# Patient Record
Sex: Female | Born: 1985 | Race: Black or African American | Hispanic: No | Marital: Single | Smoking: Current every day smoker
Health system: Southern US, Community
[De-identification: ages and names within clinical notes are randomized; demographics above are authoritative.]

## PROBLEM LIST (undated history)

## (undated) DIAGNOSIS — Z9889 Other specified postprocedural states: Secondary | ICD-10-CM

## (undated) DIAGNOSIS — R011 Cardiac murmur, unspecified: Secondary | ICD-10-CM

## (undated) DIAGNOSIS — J45909 Unspecified asthma, uncomplicated: Secondary | ICD-10-CM

## (undated) DIAGNOSIS — R112 Nausea with vomiting, unspecified: Secondary | ICD-10-CM

## (undated) HISTORY — DX: Unspecified asthma, uncomplicated: J45.909

## (undated) HISTORY — PX: LEEP: SHX91

---

## 2003-11-21 HISTORY — PX: LEEP: SHX91

## 2004-10-16 ENCOUNTER — Emergency Department: Payer: Self-pay | Admitting: Emergency Medicine

## 2004-11-08 ENCOUNTER — Ambulatory Visit: Payer: Self-pay | Admitting: Unknown Physician Specialty

## 2005-01-21 ENCOUNTER — Emergency Department: Payer: Self-pay | Admitting: Emergency Medicine

## 2006-05-02 ENCOUNTER — Observation Stay: Payer: Self-pay | Admitting: Obstetrics and Gynecology

## 2006-05-09 ENCOUNTER — Inpatient Hospital Stay: Payer: Self-pay | Admitting: Obstetrics & Gynecology

## 2007-11-21 ENCOUNTER — Emergency Department: Payer: Self-pay | Admitting: Emergency Medicine

## 2009-08-10 ENCOUNTER — Emergency Department: Payer: Self-pay | Admitting: Unknown Physician Specialty

## 2010-08-02 ENCOUNTER — Emergency Department: Payer: Self-pay | Admitting: Emergency Medicine

## 2014-12-30 ENCOUNTER — Emergency Department: Payer: Self-pay | Admitting: Emergency Medicine

## 2016-03-16 LAB — HM PAP SMEAR: HM Pap smear: NEGATIVE

## 2016-12-20 ENCOUNTER — Encounter: Payer: Self-pay | Admitting: Emergency Medicine

## 2016-12-20 ENCOUNTER — Emergency Department
Admission: EM | Admit: 2016-12-20 | Discharge: 2016-12-20 | Disposition: A | Discharge status: Home / Self Care | Payer: No Typology Code available for payment source | Attending: Emergency Medicine | Admitting: Emergency Medicine

## 2016-12-20 DIAGNOSIS — Y9389 Activity, other specified: Secondary | ICD-10-CM | POA: Insufficient documentation

## 2016-12-20 DIAGNOSIS — F17210 Nicotine dependence, cigarettes, uncomplicated: Secondary | ICD-10-CM | POA: Insufficient documentation

## 2016-12-20 DIAGNOSIS — S8001XA Contusion of right knee, initial encounter: Secondary | ICD-10-CM | POA: Diagnosis not present

## 2016-12-20 DIAGNOSIS — Y999 Unspecified external cause status: Secondary | ICD-10-CM | POA: Diagnosis not present

## 2016-12-20 DIAGNOSIS — Y92410 Unspecified street and highway as the place of occurrence of the external cause: Secondary | ICD-10-CM | POA: Insufficient documentation

## 2016-12-20 DIAGNOSIS — S8991XA Unspecified injury of right lower leg, initial encounter: Secondary | ICD-10-CM | POA: Diagnosis present

## 2016-12-20 HISTORY — DX: Cardiac murmur, unspecified: R01.1

## 2016-12-20 MED ORDER — CYCLOBENZAPRINE HCL 5 MG PO TABS: 5.0000 mg | ORAL_TABLET | Freq: Three times a day (TID) | ORAL | 0 refills | Status: DC | PRN
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
## 2017-04-18 ENCOUNTER — Encounter: Payer: Self-pay | Admitting: *Deleted

## 2017-04-18 ENCOUNTER — Emergency Department
Admission: EM | Admit: 2017-04-18 | Discharge: 2017-04-18 | Disposition: A | Discharge status: Home / Self Care | Payer: Medicaid Other | Attending: Emergency Medicine | Admitting: Emergency Medicine

## 2017-04-18 DIAGNOSIS — R05 Cough: Secondary | ICD-10-CM | POA: Diagnosis present

## 2017-04-18 DIAGNOSIS — J069 Acute upper respiratory infection, unspecified: Secondary | ICD-10-CM | POA: Diagnosis not present

## 2017-04-18 DIAGNOSIS — F17210 Nicotine dependence, cigarettes, uncomplicated: Secondary | ICD-10-CM | POA: Diagnosis not present

## 2017-04-18 MED ORDER — GUAIFENESIN-CODEINE 100-10 MG/5ML PO SOLN: 10.0000 mL | Freq: Three times a day (TID) | ORAL | 0 refills | Status: DC | PRN

## 2017-04-18 NOTE — ED Provider Notes (Signed)
[facility] Emergency Department Provider Note  ____________________________________________  Time seen: Approximately 2:56 PM  I have reviewed the triage vital signs and the nursing notes.   HISTORY  Chief Complaint Cough; Facial Pain; and Pleurisy   HPI Evelyn Stevenson is a 31 y.o. female who presents to the emergency department for evaluation of cold symptoms for the past 3 days. Chest discomfort with cough for the past 2 days that feels like pressure. She has taken OTC Allegra. Cough interrupts sleep and is productive of yellow/green sputum. No fever, no headache, nausea, or vomiting.    Past Medical History:  Diagnosis Date  . Heart murmur     There are no active problems to display for this patient.   Past Surgical History:  Procedure Laterality Date  . CESAREAN SECTION  2007    Prior to Admission medications   Medication Sig Start Date End Date Taking? Authorizing Provider  cyclobenzaprine (FLEXERIL) 5 MG tablet Take 1 tablet (5 mg total) by mouth 3 (three) times daily as needed for muscle spasms. 12/20/16   Menshew, Charlesetta Ivory, PA-C  guaiFENesin-codeine 100-10 MG/5ML syrup Take 10 mLs by mouth 3 (three) times daily as needed. 04/18/17   Chinita Pester, FNP    Allergies Patient has no known allergies.  History reviewed. No pertinent family history.  Social History Social History  Substance Use Topics  . Smoking status: Current Every Day Smoker    Packs/day: 0.50    Types: Cigarettes  . Smokeless tobacco: Never Used  . Alcohol use Yes     Comment: occassional    Review of Systems Constitutional: Negative fever/chills ENT: Negative sore throat. Right otalgia. Cardiovascular: Denies chest pain. Respiratory: Occasional shortness of breath. Positive for cough. Gastrointestinal: Negative for nausea,  Negative for vomiting.  Negative for diarrhea.  Musculoskeletal: Negative for body aches Skin: Negative for rash. Neurological:  Negative for headaches ____________________________________________   PHYSICAL EXAM:  VITAL SIGNS: ED Triage Vitals [04/18/17 1356]  Enc Vitals Group     BP 125/72     Pulse Rate 84     Resp 16     Temp 98.3 F (36.8 C)     Temp Source Oral     SpO2 100 %     Weight 230 lb (104.3 kg)     Height 5\' 9"  (1.753 m)     Head Circumference      Peak Flow      Pain Score 5     Pain Loc      Pain Edu?      Excl. in GC?     Constitutional: Alert and oriented. Well appearing and in no acute distress. Eyes: Conjunctivae are normal. EOMI. Ears: Bilateral TM mildly erythematous without loss of light reflex. Nose: Sinus congestion noted; no rhinnorhea. Mouth/Throat: Mucous membranes are moist.  Oropharynx normal. Neck: No stridor.  Lymphatic: No cervical lymphadenopathy. Cardiovascular: Normal rate, regular rhythm. Good peripheral circulation. Respiratory: Normal respiratory effort.  No retractions. Breath sounds clear to auscultation.. Gastrointestinal: Soft and nontender.  Musculoskeletal: FROM x 4 extremities.  Neurologic:  Normal speech and language.  Skin:  Skin is warm, dry and intact. No rash noted. Psychiatric: Mood and affect are normal. Speech and behavior are normal.  ____________________________________________   LABS (all labs ordered are listed, but only abnormal results are displayed)  Labs Reviewed - No data to display ____________________________________________  EKG  Ventricular rate 81 and regular. Normal sinus rhythm ____________________________________________  RADIOLOGY  Not  indicated. ____________________________________________   PROCEDURES  Procedure(s) performed: None  Critical Care performed: No ____________________________________________   INITIAL IMPRESSION / ASSESSMENT AND PLAN / ED COURSE  31 year old female presenting to the ER for evaluation of URI symptoms. She will be treated with Robitussin AC and advised to follow up with  her PCP for symptoms that are not improving over the next few days. She is to return to the ER for symptoms that change or worsen if unable to schedule an appointment.  Pertinent labs & imaging results that were available during my care of the patient were reviewed by me and considered in my medical decision making (see chart for details).  New Prescriptions   GUAIFENESIN-CODEINE 100-10 MG/5ML SYRUP    Take 10 mLs by mouth 3 (three) times daily as needed.    If controlled substance prescribed during this visit, 12 month history viewed on the [place]SRS prior to issuing an initial prescription for Schedule II or III opiod. ____________________________________________   FINAL CLINICAL IMPRESSION(S) / ED DIAGNOSES  Final diagnoses:  Acute upper respiratory infection    Note:  This document was prepared using Dragon voice recognition software and may include unintentional dictation errors.     Chinita Pesterriplett, Kimmie Doren B, FNP 04/18/17 1517    Minna AntisPaduchowski, Kevin, MD 04/22/17 1418

## 2017-04-18 NOTE — ED Triage Notes (Signed)
Pt states head congestion and cold for 3 days, states she now has a cough and is coughing up green yellow sputum, states chest tightness, awake and alert in no acute distress

## 2017-10-31 ENCOUNTER — Emergency Department
Admission: EM | Admit: 2017-10-31 | Discharge: 2017-10-31 | Disposition: A | Discharge status: Home / Self Care | Payer: Medicaid Other | Attending: Emergency Medicine | Admitting: Emergency Medicine

## 2017-10-31 ENCOUNTER — Other Ambulatory Visit: Payer: Self-pay

## 2017-10-31 ENCOUNTER — Encounter: Payer: Self-pay | Admitting: Emergency Medicine

## 2017-10-31 DIAGNOSIS — F17210 Nicotine dependence, cigarettes, uncomplicated: Secondary | ICD-10-CM | POA: Diagnosis not present

## 2017-10-31 DIAGNOSIS — B349 Viral infection, unspecified: Secondary | ICD-10-CM | POA: Diagnosis not present

## 2017-10-31 DIAGNOSIS — M7918 Myalgia, other site: Secondary | ICD-10-CM | POA: Diagnosis present

## 2017-10-31 MED ORDER — GUAIFENESIN-CODEINE 100-10 MG/5ML PO SOLN: 5.0000 mL | ORAL | 0 refills | Status: DC | PRN

## 2017-10-31 NOTE — ED Provider Notes (Signed)
[facility] Emergency Department Provider Note  ____________________________________________   First MD Initiated Contact with Patient 10/31/17 [phone]     (approximate)  I have reviewed the triage vital signs and the nursing notes.   HISTORY  Chief Complaint Generalized Body Aches   HPI Evelyn Stevenson is a 31 y.o. female is here with complaint offlu like symptoms for 6 days. Patient is been taking Tylenol and an over-the-counter cold and flu preparation. She complains of generalized body aches and cough. Patient continues to smoke one cigar per day. Patient complains of increased pain after coughing. She rates her pain as an 8 out of 10.   Past Medical History:  Diagnosis Date  . Heart murmur     There are no active problems to display for this patient.   Past Surgical History:  Procedure Laterality Date  . CESAREAN SECTION  2007    Prior to Admission medications   Medication Sig Start Date End Date Taking? Authorizing Provider  guaiFENesin-codeine 100-10 MG/5ML syrup Take 5 mLs by mouth every 4 (four) hours as needed. 10/31/17   Tommi RumpsSummers, Rhonda L, PA-C    Allergies Patient has no known allergies.  No family history on file.  Social History Social History   Tobacco Use  . Smoking status: Current Every Day Smoker    Packs/day: 0.50    Types: Cigarettes  . Smokeless tobacco: Never Used  Substance Use Topics  . Alcohol use: Yes    Comment: occassional  . Drug use: No    Review of Systems Constitutional: subjective fever/chills Eyes: No visual changes. ENT: positive sore throat. Positive nasal congestion. Cardiovascular: Denies chest pain. Respiratory: Denies shortness of breath. Positive cough. Gastrointestinal: No abdominal pain.  No nausea, no vomiting.  No diarrhea.   Skin: Negative for rash. Neurological: Negative for headaches, focal weakness or numbness. ____________________________________________   PHYSICAL  EXAM:  VITAL SIGNS: ED Triage Vitals  Enc Vitals Group     BP 10/31/17 0906 119/78     Pulse Rate 10/31/17 0906 89     Resp 10/31/17 0906 13     Temp 10/31/17 0906 98.3 F (36.8 C)     Temp Source 10/31/17 0906 Oral     SpO2 10/31/17 0906 99 %     Weight 10/31/17 0843 230 lb (104.3 kg)     Height 10/31/17 0907 5\' 9"  (1.753 m)     Head Circumference --      Peak Flow --      Pain Score 10/31/17 0843 8     Pain Loc --      Pain Edu? --      Excl. in GC? --    Constitutional: Alert and oriented. Well appearing and in no acute distress. Eyes: Conjunctivae are normal.  Head: Atraumatic. Nose: mild congestion/rhinnorhea. Mouth/Throat: Mucous membranes are moist.  Oropharynx non-erythematous. Neck: No stridor.   Hematological/Lymphatic/Immunilogical: No cervical lymphadenopathy. Cardiovascular: Normal rate, regular rhythm. Grossly normal heart sounds.  Good peripheral circulation. Respiratory: Normal respiratory effort.  No retractions. Lungs CTAB.  occasional nonproductive cough. Gastrointestinal: Soft and nontender. No distention. Bowel sounds normoactive 4 quadrants. Musculoskeletal: moves upper and lower extremities faint difficulty. Normal gait was noted. Neurologic:  Normal speech and language. No gross focal neurologic deficits are appreciated.  Skin:  Skin is warm, dry and intact.  Psychiatric: Mood and affect are normal. Speech and behavior are normal.  ____________________________________________   LABS (all labs ordered are listed, but only abnormal results are displayed)  Labs Reviewed - No data to display   PROCEDURES  Procedure(s) performed: None  Procedures  Critical Care performed: No  ____________________________________________   INITIAL IMPRESSION / ASSESSMENT AND PLAN / ED COURSE  Patient was made aware that this was a viral illness and that she should continue taking over-the-counter medication such as Tylenol or ibuprofen. She is encouraged to  increase fluids. She was given a prescription for Robitussin AC as needed for cough. She will [facility] if any continued problems. ____________________________________________   FINAL CLINICAL IMPRESSION(S) / ED DIAGNOSES  Final diagnoses:  Viral illness     ED Discharge Orders        Ordered    guaiFENesin-codeine 100-10 MG/5ML syrup  Every 4 hours PRN     10/31/17 1011       Note:  This document was prepared using Dragon voice recognition software and may include unintentional dictation errors.    Tommi RumpsSummers, Rhonda L, PA-C 10/31/17 1150    Jene EveryKinner, Robert, MD 10/31/17 1258

## 2017-10-31 NOTE — Discharge Instructions (Signed)
Continue Tylenol or ibuprofen as needed for body aches and fever. Increase fluids. Take cough medication only as directed. [facility] or [facility] if any continued problems.

## 2017-10-31 NOTE — ED Notes (Signed)
See triage note  Presents with cough and body aches since last Thursday  Unsure of fever but felt warm  States cough is prod

## 2017-10-31 NOTE — ED Triage Notes (Signed)
Pt with flu like sx.  

## 2017-11-11 ENCOUNTER — Emergency Department
Admission: EM | Admit: 2017-11-11 | Discharge: 2017-11-11 | Disposition: A | Discharge status: Home / Self Care | Payer: Medicaid Other | Attending: Emergency Medicine | Admitting: Emergency Medicine

## 2017-11-11 ENCOUNTER — Emergency Department: Payer: Medicaid Other

## 2017-11-11 ENCOUNTER — Encounter: Payer: Self-pay | Admitting: Emergency Medicine

## 2017-11-11 ENCOUNTER — Other Ambulatory Visit: Payer: Self-pay

## 2017-11-11 DIAGNOSIS — R05 Cough: Secondary | ICD-10-CM | POA: Diagnosis present

## 2017-11-11 DIAGNOSIS — F17210 Nicotine dependence, cigarettes, uncomplicated: Secondary | ICD-10-CM | POA: Diagnosis not present

## 2017-11-11 DIAGNOSIS — J209 Acute bronchitis, unspecified: Secondary | ICD-10-CM | POA: Diagnosis not present

## 2017-11-11 MED ORDER — PREDNISONE 50 MG PO TABS: ORAL_TABLET | ORAL | 0 refills | Status: DC

## 2017-11-11 MED ORDER — ACETAMINOPHEN 325 MG PO TABS
650.0000 mg | ORAL_TABLET | Freq: Once | ORAL | Status: AC
  Administered 2017-11-11: 650 mg via ORAL
  Filled 2017-11-11: qty 2

## 2017-11-11 MED ORDER — AZITHROMYCIN 250 MG PO TABS: ORAL_TABLET | ORAL | 0 refills | Status: DC

## 2017-11-11 NOTE — ED Notes (Signed)
Patient @ XR 

## 2017-11-11 NOTE — ED Provider Notes (Signed)
[facility] Emergency Department Provider Note  ____________________________________________  Time seen: Approximately 8:30 PM  I have reviewed the triage vital signs and the nursing notes.   HISTORY  Chief Complaint Cough and Generalized Body Aches    HPI Evelyn Stevenson is a 31 y.o. female presents to the emergency department with rhinorrhea, congestion, nonproductive cough and fever.  Patient states that she has been coughing for at least 3 weeks.  Cough is productive for purulent sputum production.  Patient has experienced intermittent shortness of breath.  No chest pain or chest tightness.  She is tolerating fluids and food by mouth.  No major changes in stooling or urinary habits.   Past Medical History:  Diagnosis Date  . Heart murmur     There are no active problems to display for this patient.   Past Surgical History:  Procedure Laterality Date  . CESAREAN SECTION  2007    Prior to Admission medications   Medication Sig Start Date End Date Taking? Authorizing Provider  azithromycin (ZITHROMAX) 250 MG tablet Take 2 tablets the first day. Take one tablet once a day for the next four days. 11/11/17   Orvil FeilWoods, Collie Kittel M, PA-C  guaiFENesin-codeine 100-10 MG/5ML syrup Take 5 mLs by mouth every 4 (four) hours as needed. 10/31/17   Tommi RumpsSummers, Rhonda L, PA-C  predniSONE (DELTASONE) 50 MG tablet Take one tablet once a day for the next 5 days. 11/11/17   Orvil FeilWoods, Stacia Feazell M, PA-C    Allergies Patient has no known allergies.  No family history on file.  Social History Social History   Tobacco Use  . Smoking status: Current Every Day Smoker    Packs/day: 0.50    Types: Cigarettes  . Smokeless tobacco: Never Used  Substance Use Topics  . Alcohol use: Yes    Comment: occassional  . Drug use: No     Review of Systems  Constitutional: Patient has fever.  Eyes: No visual changes. No discharge ENT: No upper respiratory complaints. Cardiovascular: no  chest pain. Respiratory: Patient has cough.  Gastrointestinal: No abdominal pain.  No nausea, no vomiting.  No diarrhea.  No constipation. Musculoskeletal: Negative for musculoskeletal pain. Skin: Negative for rash, abrasions, lacerations, ecchymosis. Neurological: Negative for headaches, focal weakness or numbness.  ___________________________________  PHYSICAL EXAM:  VITAL SIGNS: ED Triage Vitals  Enc Vitals Group     BP 11/11/17 1900 120/89     Pulse Rate 11/11/17 1857 (!) 126     Resp 11/11/17 1857 (!) 32     Temp 11/11/17 1857 98.6 F (37 C)     Temp Source 11/11/17 1857 Rectal     SpO2 11/11/17 1857 99 %     Weight 11/11/17 1857 21 lb 2.6 oz (9.6 kg)     Height 11/11/17 1859 5\' 9"  (1.753 m)     Head Circumference --      Peak Flow --      Pain Score 11/11/17 1859 9     Pain Loc --      Pain Edu? --      Excl. in GC? --      Constitutional: Alert and oriented. Well appearing and in no acute distress. Eyes: Conjunctivae are normal. PERRL. EOMI. Head: Atraumatic. ENT:      Ears: TMs are pearly bilaterally.      Nose: No congestion/rhinnorhea.      Mouth/Throat: Mucous membranes are moist.  Neck: No stridor.  No cervical spine tenderness to palpation. Cardiovascular: Normal rate,  regular rhythm. Normal S1 and S2.  Good peripheral circulation. Respiratory: Normal respiratory effort without tachypnea or retractions. Lungs CTAB. Good air entry to the bases with no decreased or absent breath sounds. Gastrointestinal: Bowel sounds 4 quadrants. Soft and nontender to palpation. No guarding or rigidity. No palpable masses. No distention. No CVA tenderness. Musculoskeletal: Full range of motion to all extremities. No gross deformities appreciated. Neurologic:  Normal speech and language. No gross focal neurologic deficits are appreciated.  Skin:  Skin is warm, dry and intact. No rash noted. Psychiatric: Mood and affect are normal. Speech and behavior are normal. Patient  exhibits appropriate insight and judgement.   ____________________________________________   LABS (all labs ordered are listed, but only abnormal results are displayed)  Labs Reviewed - No data to display ____________________________________________  EKG   ____________________________________________  RADIOLOGY Geraldo PitterI, Hadi Dubin M Audine Mangione, personally viewed and evaluated these images (plain radiographs) as part of my medical decision making, as well as reviewing the written report by the radiologist.  Dg Chest 2 View  Result Date: 11/11/2017 CLINICAL DATA:  Worsening cough with fever and chills. EXAM: CHEST  2 VIEW COMPARISON:  12/30/2014 FINDINGS: The heart size and mediastinal contours are within normal limits. Both lungs are free of consolidation but there is increased perihilar markings suggesting viral pneumonitis. No osseous findings. Worsening aeration from priors. IMPRESSION: Increased perihilar markings suggesting viral pneumonitis. No lobar consolidation. Electronically Signed   By: Elsie StainJohn T Curnes M.D.   On: 11/11/2017 19:49    ____________________________________________    PROCEDURES  Procedure(s) performed:    Procedures    Medications  acetaminophen (TYLENOL) tablet 650 mg (650 mg Oral Given 11/11/17 2015)     ____________________________________________   INITIAL IMPRESSION / ASSESSMENT AND PLAN / ED COURSE  Pertinent labs & imaging results that were available during my care of the patient were reviewed by me and considered in my medical decision making (see chart for details).  Review of the [place] CSRS was performed in accordance of the N[facility] prior to dispensing any controlled drugs.     Assessment and plan Acute bronchitis Patient presents to the emergency department with rhinorrhea, congestion and productive cough.  Chest x-ray revealed findings consistent with viral pneumonitis.  Patient was treated empirically for acute bronchitis with azithromycin and  prednisone.  She was advised to follow-up with primary care as needed.  Vital signs are reassuring prior to discharge.  All patient questions were answered.   ____________________________________________  FINAL CLINICAL IMPRESSION(S) / ED DIAGNOSES  Final diagnoses:  Acute bronchitis, unspecified organism      NEW MEDICATIONS STARTED DURING THIS VISIT:  ED Discharge Orders        Ordered    azithromycin (ZITHROMAX) 250 MG tablet     11/11/17 2001    predniSONE (DELTASONE) 50 MG tablet     11/11/17 2001          This chart was dictated using voice recognition software/Dragon. Despite best efforts to proofread, errors can occur which can change the meaning. Any change was purely unintentional.    Gasper LloydWoods, Teran Knittle M, PA-C 11/11/17 2033    Phineas SemenGoodman, Graydon, MD 11/11/17 2131

## 2017-11-11 NOTE — ED Notes (Signed)
Reviewed discharge instructions, follow-up care, and prescriptions with patient. Patient verbalized understanding of all information reviewed. Patient stable, with no distress noted at this time.    

## 2017-11-11 NOTE — ED Triage Notes (Signed)
FIRST NURSE NOTE-seen earlier this month for viral illness. Still c/o cough, feeling bad and subjective fever. NAD

## 2017-11-11 NOTE — ED Triage Notes (Signed)
Pt states that she was seen last week and diagnosed with viral illness. Pt states that since that time her cough has gotten worse, she is having body aches and chills. Pt in NAD at this time.

## 2017-11-20 NOTE — L&D Delivery Note (Signed)
Delivery Note At 3:01 PM a viable female was delivered via VBAC, Spontaneous (Presentation: LOA).  APGAR: 8, 9; weight 6 lb 10.9 oz (3030 grams).   Placenta status: delivered, spontaneously, intact.  Cord: 3VC with the following complications: .  Cord pH: n/a  Anesthesia:  none Episiotomy: None Lacerations: 1st degree Suture Repair: 3.0 vicryl Quantitative Blood Loss (mL): 680  Mom to postpartum.  Baby to Couplet care / Skin to Skin.  Called to see patient.  Mom had progressed very quickly to complete.  Mom pushed to deliver a viable female infant.  The head followed by shoulders, which delivered without difficulty, and the rest of the body.  No nuchal cord noted.  Baby to mom's chest.  Placenta quickly delivered spontaneously.  It was intact with a 3-vessel cord. Cord clamped and cut due to quick delivery of placenta.  Cord blood obtained.  First degree perineal laceration repaired with 3-0 Vicryl in standard fashion.  All counts correct.  Some heavier-than-normal vaginal bleeding noted after delivery.  Pitocin 10 units IM given after delivery.  She was also given methergine 0.2 mg IM.  IV was then started and hemostasis obtained with IV pitocin and fundal massage. QBL 680 mL.     Thomasene MohairStephen Ruthe Roemer, MD 10/26/2018, 3:50 PM

## 2018-03-26 ENCOUNTER — Other Ambulatory Visit: Payer: Self-pay | Admitting: Nurse Practitioner

## 2018-03-26 DIAGNOSIS — Z369 Encounter for antenatal screening, unspecified: Secondary | ICD-10-CM

## 2018-04-25 ENCOUNTER — Ambulatory Visit (HOSPITAL_BASED_OUTPATIENT_CLINIC_OR_DEPARTMENT_OTHER)
Admission: RE | Admit: 2018-04-25 | Discharge: 2018-04-25 | Disposition: A | Discharge status: Home / Self Care | Payer: Medicaid Other | Source: Ambulatory Visit | Attending: Obstetrics and Gynecology | Admitting: Obstetrics and Gynecology

## 2018-04-25 ENCOUNTER — Ambulatory Visit
Admission: RE | Admit: 2018-04-25 | Discharge: 2018-04-25 | Disposition: A | Discharge status: Home / Self Care | Payer: Medicaid Other | Source: Ambulatory Visit | Attending: Obstetrics and Gynecology | Admitting: Obstetrics and Gynecology

## 2018-04-25 VITALS — BP 119/71 | HR 83 | Temp 98.3°F | Resp 18 | Wt 239.6 lb

## 2018-04-25 DIAGNOSIS — Z3A12 12 weeks gestation of pregnancy: Secondary | ICD-10-CM | POA: Insufficient documentation

## 2018-04-25 DIAGNOSIS — Z369 Encounter for antenatal screening, unspecified: Secondary | ICD-10-CM

## 2018-04-25 DIAGNOSIS — O30041 Twin pregnancy, dichorionic/diamniotic, first trimester: Secondary | ICD-10-CM | POA: Insufficient documentation

## 2018-04-25 DIAGNOSIS — Z3689 Encounter for other specified antenatal screening: Secondary | ICD-10-CM | POA: Insufficient documentation

## 2018-04-25 DIAGNOSIS — O3121X0 Continuing pregnancy after intrauterine death of one fetus or more, first trimester, not applicable or unspecified: Secondary | ICD-10-CM | POA: Diagnosis not present

## 2018-04-25 DIAGNOSIS — Z818 Family history of other mental and behavioral disorders: Secondary | ICD-10-CM

## 2018-04-25 NOTE — Progress Notes (Addendum)
Referring physician:  [facility] Length of Consultation: 40 minutes   [name]  was [facility] to [facility] for genetic counseling to review prenatal screening and testing options as well as the family history of autism.  This note summarizes the information we discussed.    We offered the following routine screening tests for this pregnancy:  First trimester screening, which includes nuchal translucency ultrasound screen and first trimester maternal serum marker screening.  The nuchal translucency has approximately an 80% detection rate for Down syndrome and can be positive for other chromosome abnormalities as well as congenital heart defects.  When combined with a maternal serum marker screening, the detection rate is up to 90% for Down syndrome and up to 97% for trisomy 18.     Maternal serum marker screening, a blood test that measures pregnancy proteins, can provide risk assessments for Down syndrome, trisomy 18, and open neural tube defects (spina bifida, anencephaly). Because it does not directly examine the fetus, it cannot positively diagnose or rule out these problems.  Targeted ultrasound uses high frequency sound waves to create an image of the developing fetus.  An ultrasound is often recommended as a routine means of evaluating the pregnancy.  It is also used to screen for fetal anatomy problems (for example, a heart defect) that might be suggestive of a chromosomal or other abnormality.   Should these screening tests indicate an increased concern, then the following additional testing options would be offered:  The chorionic villus sampling procedure is available for first trimester chromosome analysis.  This involves the withdrawal of a small amount of chorionic villi (tissue from the developing placenta).  Risk of pregnancy loss is estimated to be approximately 1 in 200 to 1 in 100 (0.5 to 1%).  There is approximately a 1% (1 in 100) chance that the CVS  chromosome results will be unclear.  Chorionic villi cannot be tested for neural tube defects.     Amniocentesis involves the removal of a small amount of amniotic fluid from the sac surrounding the fetus with the use of a thin needle inserted through the maternal abdomen and uterus.  Ultrasound guidance is used throughout the procedure.  Fetal cells from amniotic fluid are directly evaluated and > 99.5% of chromosome problems and > 98% of open neural tube defects can be detected. This procedure is generally performed after the 15th week of pregnancy.  The main risks to this procedure include complications leading to miscarriage in less than 1 in 200 cases (0.5%).  As another option for information if the pregnancy is suspected to be an an increased chance for certain chromosome conditions, we also reviewed the availability of cell free fetal DNA testing from maternal blood to determine whether or not the baby may Stevenson either Down syndrome, trisomy 2713, or trisomy 818.  This test utilizes a maternal blood sample and DNA sequencing technology to isolate circulating cell free fetal DNA from maternal plasma.  The fetal DNA can then be analyzed for DNA sequences that are derived from the three most common chromosomes involved in aneuploidy, chromosomes 13, 18, and 21.  If the overall amount of DNA is greater than the expected level for any of these chromosomes, aneuploidy is suspected.  While we do not consider it a replacement for invasive testing and karyotype analysis, a negative result from this testing would be reassuring, though not a guarantee of a normal chromosome complement for the baby.  An abnormal result is certainly  suggestive of an abnormal chromosome complement, though we would still recommend CVS or amniocentesis to confirm any findings from this testing.  Cystic Fibrosis and Spinal Muscular Atrophy (SMA) screening were also discussed with the patient. Both conditions are recessive, which means that  both parents must be carriers in order to Stevenson a child with the disease.  Cystic fibrosis (CF) is one of the most common genetic conditions in persons of Caucasian ancestry.  This condition occurs in approximately 1 in 2,500 Caucasian persons and results in thickened secretions in the lungs, digestive, and reproductive systems.  For a baby to be at risk for having CF, both of the parents must be carriers for this condition.  Approximately 1 in 53 Caucasian persons is a carrier for CF.  Current carrier testing looks for the most common mutations in the gene for CF and can detect approximately 90% of carriers in the Caucasian population.  This means that the carrier screening can greatly reduce, but cannot eliminate, the chance for an individual to Stevenson a child with CF.  If an individual is found to be a carrier for CF, then carrier testing would be available for the partner. As part of [place]'s newborn screening profile, all babies born in the state of [place] a two-tier screening process.  Specimens are first tested to determine the concentration of immunoreactive trypsinogen (IRT).  The top 5% of specimens with the highest IRT values then undergo DNA testing using a panel of over 40 common CF mutations. SMA is a neurodegenerative disorder that leads to atrophy of skeletal muscle and overall weakness.  This condition is also more prevalent in the Caucasian population, with 1 in 40-1 in 60 persons being a carrier and 1 in 6,000-1 in 10,000 children being affected.  There are multiple forms of the disease, with some causing death in infancy to other forms with survival into adulthood.  The genetics of SMA is complex, but carrier screening can detect up to 95% of carriers in the Caucasian population.  Similar to CF, a negative result can greatly reduce, but cannot eliminate, the chance to Stevenson a child with SMA.  We obtained a detailed family history and pregnancy history.  The patient  reported that her maternal half brother has a son with autism.  He is 79 years old and the patient is not aware of any specific genetic testing he may Stevenson had.  With regard to the family history of autism, we explained that this condition is thought to be inherited in a mutifactorial manner, or due to a combination of genetic factors and environmental factors, or a characteristic of an underlying genetic condition that follows a traditional Mendelian inheritance pattern.  The suspected underlying genetic changes that predispose an individual to autism are complex, and not completely understood at this time.  If more is learned about the cause in this nephew, we are happy to review this further.  We also discussed the option of screening for Fragile X syndrome, which is one of the most common causes of inherited mental retardation in males, and may present with symptoms of autism.  However, Fragile X syndrome cannot be passed from father to son, so if this is the cause for the nephew, then it would Stevenson been inherited from his mother, so testing of Evelyn Stevenson would not be indicated for this history.  Evelyn Stevenson, the father of the baby, reported that he had a polyp on his pancreas removed last year and that  his father and paternal grandmother had a history of some type of polyps as well.  These were not associated with colon cancer, and he was not aware of where their polyps were.  He has a follow up visit next month and was encouraged to ask if they were concerned about a specific genetic condition.  We are happy to discuss this if more is learned. The remainder of the family history was reported to be unremarkable for birth defects, intellectual delays, recurrent pregnancy loss or known chromosome abnormalities.  Evelyn Stevenson stated that this is her second pregnancy, the first with Evelyn Stevenson.  She has a 87 year old daughter who is in good health.  She reported no complications or medications exposures in this pregnancy.   Prior to learning she was pregnant at [redacted] weeks gestation, she was smoking cigarettes and marijuana.  She stopped at that time.  These exposures are known to be associated with low birth weight, preterm delivery and poor pregnancy outcomes.  Therefore, we encourage Evelyn Stevenson to continue to avoid these for the remainder of the pregnancy.  After consideration of the options, Evelyn Stevenson elected to proceed with an ultrasound and first trimester screening and to decline CF and SMA carrier screening.  Previous hemoglobinopathy screening was negative.  An ultrasound was performed at the time of the visit.  The gestational age was consistent with 12 weeks, 5 days.  However, this revealed the loss of a twin gestation measuring 8 weeks.  Fetal anatomy could not be assessed due to early gestational age.  Please refer to the ultrasound report for details of that study.  Given the recent loss of one twin, the biochemical testing for first trimester screening was not ordered today, as this may impact the analyte values and complicate the interpretation of the results.  A maternal serum tetra screen could be considered or cell free fetal DNA testing at the time of follow up ultrasound in 4 weeks.  Evelyn Stevenson was encouraged to call with questions or concerns.  We can be contacted at [phone].  Labs ordered: none  Evelyn Anderson, MS, CGC  Evelyn Stack, MS, CGC performed an integral service incident to the physician's initial service.  I was physically present in the clinical area and was immediately available to render assistance.   [place]elyn [place] [facility] [place]

## 2018-05-15 ENCOUNTER — Other Ambulatory Visit: Payer: Self-pay

## 2018-05-15 ENCOUNTER — Emergency Department
Admission: EM | Admit: 2018-05-15 | Discharge: 2018-05-15 | Disposition: A | Discharge status: Home / Self Care | Payer: Medicaid Other | Attending: Student in an Organized Health Care Education/Training Program | Admitting: Student in an Organized Health Care Education/Training Program

## 2018-05-15 ENCOUNTER — Encounter: Payer: Self-pay | Admitting: Emergency Medicine

## 2018-05-15 DIAGNOSIS — O9989 Other specified diseases and conditions complicating pregnancy, childbirth and the puerperium: Secondary | ICD-10-CM | POA: Insufficient documentation

## 2018-05-15 DIAGNOSIS — J0110 Acute frontal sinusitis, unspecified: Secondary | ICD-10-CM | POA: Insufficient documentation

## 2018-05-15 DIAGNOSIS — H6502 Acute serous otitis media, left ear: Secondary | ICD-10-CM

## 2018-05-15 DIAGNOSIS — Z3A16 16 weeks gestation of pregnancy: Secondary | ICD-10-CM | POA: Diagnosis not present

## 2018-05-15 DIAGNOSIS — H9202 Otalgia, left ear: Secondary | ICD-10-CM

## 2018-05-15 DIAGNOSIS — O99332 Smoking (tobacco) complicating pregnancy, second trimester: Secondary | ICD-10-CM | POA: Insufficient documentation

## 2018-05-15 DIAGNOSIS — F17210 Nicotine dependence, cigarettes, uncomplicated: Secondary | ICD-10-CM | POA: Diagnosis not present

## 2018-05-15 MED ORDER — FLUTICASONE PROPIONATE 50 MCG/ACT NA SUSP: 2.0000 | Freq: Every day | NASAL | 0 refills | Status: DC

## 2018-05-15 MED ORDER — CETIRIZINE HCL 5 MG PO TABS: 5.0000 mg | ORAL_TABLET | Freq: Every day | ORAL | 0 refills | Status: DC

## 2018-05-15 NOTE — ED Notes (Signed)
First Nurse Note:  Patient complaining of bilateral ear pain and sore throat.  Pregnant - Due date 11/02/18.

## 2018-05-15 NOTE — ED Provider Notes (Signed)
[facility] Emergency Department Provider Note ____________________________________________  Time seen: 1016  I have reviewed the triage vital signs and the nursing notes.  HISTORY  Chief Complaint  Otalgia  HPI Evelyn Stevenson is a 32 y.o. female presents to the ED for evaluation of bilateral otalgia.  Patient denies any fevers, chills, sweats.  Also denies any cough, congestion, fevers, chills, or nausea.  Patient is about [redacted] weeks gestational age without any vaginal complaints.  Has not taken any medications over-the-counter for her symptoms due to her pregnancy status.  Past Medical History:  Diagnosis Date  . Heart murmur     Patient Active Problem List   Diagnosis Date Noted  . First trimester screening   . Family history of autism     Past Surgical History:  Procedure Laterality Date  . CESAREAN SECTION  2007    Prior to Admission medications   Medication Sig Start Date End Date Taking? Authorizing Provider  cetirizine (ZYRTEC) 5 MG tablet Take 1 tablet (5 mg total) by mouth daily. 05/15/18   Zada Haser, Charlesetta Ivory, PA-C  fluticasone (FLONASE) 50 MCG/ACT nasal spray Place 2 sprays into both nostrils daily. 05/15/18   Abriel Geesey, Charlesetta Ivory, PA-C  promethazine (PHENERGAN) 25 MG tablet Take 25 mg by mouth every 6 (six) hours as needed for nausea or vomiting.    [provider]    Allergies Patient has no known allergies.  No family history on file.  Social History Social History   Tobacco Use  . Smoking status: Current Every Day Smoker    Packs/day: 0.50    Types: Cigarettes  . Smokeless tobacco: Never Used  Substance Use Topics  . Alcohol use: Yes    Comment: occassional  . Drug use: No    Review of Systems  Constitutional: Negative for fever. Eyes: Negative for visual changes. ENT: Negative for sore throat.  Reports otalgia as above. Cardiovascular: Negative for chest pain. Respiratory: Negative for shortness of  breath. Gastrointestinal: Negative for abdominal pain, vomiting and diarrhea. Genitourinary: Negative for dysuria. Musculoskeletal: Negative for back pain. Skin: Negative for rash. Neurological: Negative for headaches, focal weakness or numbness. ____________________________________________  PHYSICAL EXAM:  VITAL SIGNS: ED Triage Vitals  Enc Vitals Group     BP 05/15/18 0922 121/65     Pulse Rate 05/15/18 0920 78     Resp 05/15/18 0920 18     Temp 05/15/18 0920 98.9 F (37.2 C)     Temp Source 05/15/18 0920 Oral     SpO2 05/15/18 0920 100 %     Weight 05/15/18 0920 234 lb (106.1 kg)     Height 05/15/18 0920 5\' 9"  (1.753 m)     Head Circumference --      Peak Flow --      Pain Score 05/15/18 0920 7     Pain Loc --      Pain Edu? --      Excl. in GC? --     Constitutional: Alert and oriented. Well appearing and in no distress. Head: Normocephalic and atraumatic. Eyes: Conjunctivae are normal. PERRL. Normal extraocular movements Ears: Canals clear. TMs intact bilaterally.  TMs are injected and show serous effusion bilaterally. Nose: No congestion/rhinorrhea/epistaxis.  Turbinates are edematous and erythematous. Mouth/Throat: Mucous membranes are moist. Neck: Supple. No thyromegaly. Hematological/Lymphatic/Immunological: No cervical lymphadenopathy. Cardiovascular: Normal rate, regular rhythm. Normal distal pulses. Respiratory: Normal respiratory effort. No wheezes/rales/rhonchi. Gastrointestinal: Soft and nontender. No distention.  Gravid.  Fetal heart tones  assessed with bedside Doppler.  FHR in the 170s Skin:  Skin is warm, dry and intact. No rash noted. ____________________________________________  INITIAL IMPRESSION / ASSESSMENT AND PLAN / ED COURSE  Patient with ED evaluation of bilateral otalgia.  Patient's exam is overall benign.  She has left greater than right serous effusion noted.  She also has some sinus congestion.  Patient will be treated with Flonase  cetirizine which are safe in pregnancy.  She is also encouraged to take over-the-counter eczematoid and as needed for cough.  She will follow-up with OB provider next week as scheduled.  Return precautions have been reviewed. ____________________________________________  FINAL CLINICAL IMPRESSION(S) / ED DIAGNOSES  Final diagnoses:  Left ear pain  Acute frontal sinusitis, recurrence not specified  Non-recurrent acute serous otitis media of left ear      Lissa HoardMenshew, Honestie Kulik V Bacon, PA-C 05/15/18 1907    Willy Eddyobinson, Patrick, MD 05/16/18 1112

## 2018-05-15 NOTE — ED Notes (Signed)
See triage note    Presents with bilateral ear pain which started about 1 week ago  No fever or drainage   Pt is also 16 weeks preg  Denies any vaginal pain or bleeding

## 2018-05-15 NOTE — ED Triage Notes (Signed)
Bilateral ear pain for a week now.

## 2018-05-15 NOTE — Discharge Instructions (Signed)
Your exam is consistent with a mild sinusitis and ear pressure due to fluids. Take the prescription meds as directed. [facility] scheduled.

## 2018-06-03 ENCOUNTER — Other Ambulatory Visit: Payer: Self-pay | Admitting: *Deleted

## 2018-06-03 DIAGNOSIS — Z818 Family history of other mental and behavioral disorders: Secondary | ICD-10-CM

## 2018-06-06 ENCOUNTER — Ambulatory Visit
Admission: RE | Admit: 2018-06-06 | Discharge: 2018-06-06 | Disposition: A | Discharge status: Home / Self Care | Payer: Medicaid Other | Source: Ambulatory Visit | Attending: Maternal & Fetal Medicine | Admitting: Maternal & Fetal Medicine

## 2018-06-06 DIAGNOSIS — Z818 Family history of other mental and behavioral disorders: Secondary | ICD-10-CM

## 2018-08-12 ENCOUNTER — Other Ambulatory Visit: Payer: Self-pay

## 2018-08-12 DIAGNOSIS — O30009 Twin pregnancy, unspecified number of placenta and unspecified number of amniotic sacs, unspecified trimester: Secondary | ICD-10-CM

## 2018-08-15 ENCOUNTER — Ambulatory Visit
Admission: RE | Admit: 2018-08-15 | Discharge: 2018-08-15 | Disposition: A | Discharge status: Home / Self Care | Payer: Medicaid Other | Source: Ambulatory Visit | Attending: Maternal and Fetal Medicine | Admitting: Maternal and Fetal Medicine

## 2018-08-15 DIAGNOSIS — O321XX0 Maternal care for breech presentation, not applicable or unspecified: Secondary | ICD-10-CM | POA: Diagnosis not present

## 2018-08-15 DIAGNOSIS — Z3A28 28 weeks gestation of pregnancy: Secondary | ICD-10-CM | POA: Insufficient documentation

## 2018-08-15 DIAGNOSIS — O30009 Twin pregnancy, unspecified number of placenta and unspecified number of amniotic sacs, unspecified trimester: Secondary | ICD-10-CM

## 2018-08-15 DIAGNOSIS — Z3689 Encounter for other specified antenatal screening: Secondary | ICD-10-CM | POA: Insufficient documentation

## 2018-08-15 LAB — HM HIV SCREENING LAB: HM HIV Screening: NEGATIVE

## 2018-08-26 ENCOUNTER — Telehealth: Payer: Self-pay | Admitting: Obstetrics and Gynecology

## 2018-08-26 ENCOUNTER — Encounter: Payer: Self-pay | Admitting: Obstetrics & Gynecology

## 2018-08-26 ENCOUNTER — Ambulatory Visit (INDEPENDENT_AMBULATORY_CARE_PROVIDER_SITE_OTHER): Payer: Medicaid Other | Admitting: Obstetrics & Gynecology

## 2018-08-26 VITALS — BP 120/80 | Ht 69.0 in | Wt 252.0 lb

## 2018-08-26 DIAGNOSIS — Z98891 History of uterine scar from previous surgery: Secondary | ICD-10-CM | POA: Diagnosis not present

## 2018-08-26 DIAGNOSIS — Z3A30 30 weeks gestation of pregnancy: Secondary | ICD-10-CM

## 2018-08-26 NOTE — Telephone Encounter (Signed)
Lmtrc

## 2018-08-26 NOTE — Telephone Encounter (Signed)
-----   Message from Nadara Mustard, MD sent at 08/26/2018  9:30 AM EDT ----- Regarding: SURGERY CS Surgery Booking Request Patient Full Name:  Evelyn Stevenson  MRN: [id number]  DOB: [birth year]  Surgeon: Bonney Aid, MD  Requested Surgery Date and Time: 10/29/18 Primary Diagnosis AND Code: Repeat CS at term, Sterility Secondary Diagnosis and Code:  Surgical Procedure: Cesarean section with tubal ligation L&D Notification: No Admission Status: surgery admit Length of Surgery: 1 hour Special Case Needs: OnQ H&P: yes (date) Phone Interview???: no Interpreter: Language:  Medical Clearance: no Special Scheduling Instructions: no  Please call L&D to schedule as well on their calender

## 2018-08-26 NOTE — Progress Notes (Addendum)
Consulting Group: [facility] Reason for Consult: Prior cesarean, delivery planning  08/26/2018  History of Present Illness: Ms. Borello is a 32 y.o. G2P1001 [redacted]w[redacted]d based on Patient's last menstrual period was 01/23/2018. with an Estimated Date of Delivery: 11/02/18, with concern for prior CS.  PNC at [facility], twin at start of pregnancy but had one fetal demise early on, no other problems since that time.  Last CS was 2007 for [facility], Dr Luella Cook). Desires sterility.  Preg compl by substance use (MJ), obesity (BMI 37), prior LEEP (12+ years ago)  History of varicella: Yes   ROS: A 12-point review of systems was performed and negative, except as stated in the above HPI.  OBGYN History: As per HPI. OB History  Gravida Para Term Preterm AB Living  2 1 1     1   SAB TAB Ectopic Multiple Live Births               # Outcome Date GA Lbr Len/2nd Weight Sex Delivery Anes PTL Lv  2 Current           1 Term             Any issues with any prior pregnancies: CS Any prior children are healthy, doing well, without any problems or issues: yes History of pap smears: Yes. Last pap smear this preg. Abnormal: no  History of STIs: No   Past Medical History: Past Medical History:  Diagnosis Date  . Heart murmur     Past Surgical History: Past Surgical History:  Procedure Laterality Date  . CESAREAN SECTION  2007  . LEEP      Family History:  Family History  Problem Relation Age of Onset  . Hypertension Mother   . Hypertension Father   . Cancer Maternal Grandmother    She denies any female cancers, bleeding or blood clotting disorders.  She denies any history of mental retardation, birth defects or genetic disorders in her or the FOB's history  Social History:  Social History   Socioeconomic History  . Marital status: Single    Spouse name: Not on file  . Number of children: Not on file  . Years of education: Not on file  . Highest education level: Not on file  Occupational History  .  Not on file  Social Needs  . Financial resource strain: Not on file  . Food insecurity:    Worry: Not on file    Inability: Not on file  . Transportation needs:    Medical: Not on file    Non-medical: Not on file  Tobacco Use  . Smoking status: Former Smoker    Packs/day: 0.50    Types: Cigarettes  . Smokeless tobacco: Never Used  Substance and Sexual Activity  . Alcohol use: Not Currently    Comment: occassional  . Drug use: No    Comment: pt states "smoked marijuana at first of my pregnancy, but then quit when I found out I was pregnant"  . Sexual activity: Yes    Birth control/protection: None  Lifestyle  . Physical activity:    Days per week: Not on file    Minutes per session: Not on file  . Stress: Not on file  Relationships  . Social connections:    Talks on phone: Not on file    Gets together: Not on file    Attends religious service: Not on file    Active member of club or organization: Not on file  Attends meetings of clubs or organizations: Not on file    Relationship status: Not on file  . Intimate partner violence:    Fear of current or ex partner: Not on file    Emotionally abused: Not on file    Physically abused: Not on file    Forced sexual activity: Not on file  Other Topics Concern  . Not on file  Social History Narrative  . Not on file   Any pets in the household: no  Allergy: No Known Allergies  Current Outpatient Medications:  Current Outpatient Medications:  .  cetirizine (ZYRTEC) 5 MG tablet, Take 1 tablet (5 mg total) by mouth daily. (Patient not taking: Reported on 06/06/2018), Disp: 30 tablet, Rfl: 0 .  fluticasone (FLONASE) 50 MCG/ACT nasal spray, Place 2 sprays into both nostrils daily. (Patient not taking: Reported on 06/06/2018), Disp: 16 g, Rfl: 0 .  promethazine (PHENERGAN) 25 MG tablet, Take 25 mg by mouth every 6 (six) hours as needed for nausea or vomiting., Disp: , Rfl:    Physical Exam:   BP 120/80   Ht 5\' 9"  (1.753 m)    Wt 252 lb (114.3 kg)   LMP 01/23/2018   BMI 37.21 kg/m  Body mass index is 37.21 kg/m. Constitutional: Well nourished, well developed female in no acute distress.  Neck:  Supple, normal appearance, and no thyromegaly  Cardiovascular: S1, S2 normal, no murmur, rub or gallop, regular rate and rhythm Respiratory:  Clear to auscultation bilateral. Normal respiratory effort Abdomen: positive bowel sounds and no masses, hernias; diffusely non tender to palpation, non distended Breasts: not examined. Neuro/Psych:  Normal mood and affect.  Skin:  Warm and dry.  Lymphatic:  No inguinal lymphadenopathy.   Assessment: Ms. Fajardo is a 32 y.o. G2P1001 [redacted]w[redacted]d based on Patient's last menstrual period was 01/23/2018. with an Estimated Date of Delivery: 11/02/18,  for prenatal care.  Plan:  OB/GYN  Counseling Note  32 y.o. G2P1001 at [redacted]w[redacted]d with Estimated Date of Delivery: 11/02/18 was seen today in office to discuss trial of labor after cesarean section (TOLAC) versus elective repeat cesarean delivery (ERCD). The following risks were discussed with the patient.  Risk of uterine rupture at term is 0.78 percent with TOLAC and 0.22 percent with ERCD. 1 in 10 uterine ruptures will result in neonatal death or neurological injury. The benefits of a trial of labor after cesarean (TOLAC) resulting in a vaginal birth after cesarean (VBAC) include the following: shorter length of hospital stay and postpartum recovery (in most cases); fewer complications, such as postpartum fever, wound or uterine infection, thromboembolism (blood clots in the leg or lung), need for blood transfusion and fewer neonatal breathing problems.  The risks of an attempted VBAC or TOLAC include the following: Risk of failed trial of labor after cesarean (TOLAC) without a vaginal birth after cesarean (VBAC) resulting in repeat cesarean delivery (RCD) in about 20 to 40 percent of women who attempt VBAC.  Risk of rupture of uterus resulting  in an emergency cesarean delivery. The risk of uterine rupture may be related in part to the type of uterine incision made during the first cesarean delivery. A previous transverse uterine incision has the lowest risk of rupture (0.2 to 1.5 percent risk). Vertical or T-shaped uterine incisions have a higher risk of uterine rupture (4 to 9 percent risk)The risk of fetal death is very low with both VBAC and elective repeat cesarean delivery (ERCD), but the likelihood of fetal death is higher with VBAC  than with ERCD. Maternal death is very rare with either type of delivery.  The risks of an elective repeat cesarean delivery (ERCD) were reviewed with the patient including but not limited to: 12/998 risk of uterine rupture which could have serious consequences, bleeding which may require transfusion; infection which may require antibiotics; injury to bowel, bladder or other surrounding organs (bowel, bladder, ureters); injury to the fetus; need for additional procedures including hysterectomy in the event of a life-threatening hemorrhage; thromboembolic phenomenon; abnormal placentation; incisional problems; death and other postoperative or anesthesia complications.    Pt prefers CS along w BTL at 39 weeks, or in labor.    Scheduled for Oct 29, 2018  Problem list reviewed and updated.  Annamarie Major, MD, [facility], [facility] 08/26/2018  9:34 AM

## 2018-08-26 NOTE — Patient Instructions (Signed)
Vaginal Birth After Cesarean Delivery Vs Cesarean at 39 weeks (Scheduled for Dec 10)  Vaginal birth after cesarean delivery (VBAC) is giving birth vaginally after previously delivering a baby by a cesarean. In the past, if a woman had a cesarean delivery, all births afterward would be done by cesarean delivery. This is no longer true. It can be safe for the mother to try a vaginal delivery after having a cesarean delivery. It is important to discuss VBAC with your health care provider early in the pregnancy so you can understand the risks, benefits, and options. It will give you time to decide what is best in your particular case. The final decision about whether to have a VBAC or repeat cesarean delivery should be between you and your health care provider. Any changes in your health or your baby's health during your pregnancy may make it necessary to change your initial decision about VBAC. Women who plan to have a VBAC should check with their health care provider to be sure that:  The previous cesarean delivery was done with a low transverse uterine cut (incision) (not a vertical classical incision).  The birth canal is big enough for the baby.  There were no other operations on the uterus.  An electronic fetal monitor (EFM) will be on at all times during labor.  An operating room will be available and ready in case an emergency cesarean delivery is needed.  A health care provider and surgical nursing staff will be available at all times during labor to be ready to do an emergency delivery cesarean if necessary.  An anesthesiologist will be present in case an emergency cesarean delivery is needed.  The nursery is prepared and has adequate personnel and necessary equipment available to care for the baby in case of an emergency cesarean delivery. Benefits of VBAC  Shorter stay in the hospital.  Avoidance of risks associated with cesarean delivery, such as: ? Surgical complications, such  as opening of the incision or hernia in the incision. ? Injury to other organs. ? Fever. This can occur if an infection develops after surgery. It can also occur as a reaction to the medicine given to make you numb during the surgery.  Less blood loss and need for blood transfusions.  Lower risk of blood clots and infection.  Shorter recovery.  Decreased risk for having to remove the uterus (hysterectomy).  Decreased risk for the placenta to completely or partially cover the opening of the uterus (placenta previa) with a future pregnancy.  Decrease risk in future labor and delivery. Risks of a VBAC  Tearing (rupture) of the uterus. This is occurs in less than 1% of VBACs. The risk of this happening is higher if: ? Steps are taken to begin the labor process (induce labor) or stimulate or strengthen contractions (augment labor). ? Medicine is used to soften (ripen) the cervix.  Having to remove the uterus (hysterectomy) if it ruptures. VBAC should not be done if:  The previous cesarean delivery was done with a vertical (classical) or T-shaped incision or you do not know what kind of incision was made.  You had a ruptured uterus.  You have had certain types of surgery on your uterus, such as removal of uterine fibroids. Ask your health care provider about other types of surgeries that prevent you from having a VBAC.  You have certain medical or childbirth (obstetrical) problems.  There are problems with the baby.  You have had two previous cesarean deliveries and no  vaginal deliveries. Other facts to know about VBAC:  It is safe to have an epidural anesthetic with VBAC.  It is safe to turn the baby from a breech position (attempt an external cephalic version).  It is safe to try a VBAC with twins.  VBAC may not be successful if your baby weights 8.8 lb (4 kg) or more. However, weight predictions are not always accurate and should not be used alone to decide if VBAC is right  for you.  There is an increased failure rate if the time between the cesarean delivery and VBAC is less than 19 months.  Your health care provider may advise against a VBAC if you have preeclampsia (high blood pressure, protein in the urine, and swelling of face and extremities).  VBAC is often successful if you previously gave birth vaginally.  VBAC is often successful when the labor starts spontaneously before the due date.  Delivering a baby through a VBAC is similar to having a normal spontaneous vaginal delivery. This information is not intended to replace advice given to you by your health care provider. Make sure you discuss any questions you have with your health care provider. Document Released: 04/29/2007 Document Revised: 04/13/2016 Document Reviewed: 06/05/2013 Elsevier Interactive Patient Education  [employer].

## 2018-08-29 NOTE — Telephone Encounter (Signed)
Lmtrc

## 2018-08-30 NOTE — Telephone Encounter (Signed)
Lmtrc

## 2018-09-03 NOTE — Telephone Encounter (Signed)
Patient is aware of H&P at [facility] on 10/28/18 @ 8:50am w/ Dr Bonney Aid, Pre-admit Testing at 10:00am, and OR on 10/29/18.

## 2018-09-04 ENCOUNTER — Observation Stay
Admission: EM | Admit: 2018-09-04 | Discharge: 2018-09-04 | Disposition: A | Discharge status: Home / Self Care | Payer: Medicaid Other | Attending: Obstetrics and Gynecology | Admitting: Obstetrics and Gynecology

## 2018-09-04 ENCOUNTER — Other Ambulatory Visit: Payer: Self-pay

## 2018-09-04 DIAGNOSIS — O4703 False labor before 37 completed weeks of gestation, third trimester: Secondary | ICD-10-CM | POA: Diagnosis present

## 2018-09-04 DIAGNOSIS — Z87891 Personal history of nicotine dependence: Secondary | ICD-10-CM | POA: Diagnosis not present

## 2018-09-04 DIAGNOSIS — Z3A31 31 weeks gestation of pregnancy: Secondary | ICD-10-CM | POA: Insufficient documentation

## 2018-09-04 DIAGNOSIS — O471 False labor at or after 37 completed weeks of gestation: Secondary | ICD-10-CM | POA: Diagnosis not present

## 2018-09-04 LAB — URINALYSIS, COMPLETE (UACMP) WITH MICROSCOPIC
Bacteria, UA: NONE SEEN
Bilirubin Urine: NEGATIVE
GLUCOSE, UA: NEGATIVE mg/dL
Hgb urine dipstick: NEGATIVE
Ketones, ur: 20 mg/dL — AB
Leukocytes, UA: NEGATIVE
Nitrite: NEGATIVE
PH: 6 (ref 5.0–8.0)
Protein, ur: NEGATIVE mg/dL
SPECIFIC GRAVITY, URINE: 1.01 (ref 1.005–1.030)

## 2018-09-04 NOTE — Discharge Instructions (Signed)
Reviewed preterm labor instructions, follow up with provider at scheduled appointment and pre-procedure in December with patient.

## 2018-09-04 NOTE — Final Progress Note (Signed)
Physician Final Progress Note  Patient ID: Evelyn Stevenson MRN: [id number] DOB/AGE: 1985/12/19 32 y.o.  Admit date: 09/04/2018 Admitting provider: Conard Novak, MD Discharge date: 09/04/2018   Admission Diagnoses:  1) intrauterine pregnancy at [redacted]w[redacted]d  2) Concern for labor with contractions  Discharge Diagnoses:  1) intrauterine pregnancy at [redacted]w[redacted]d  2) Concern for labor with contractions, false labor  History of Present Illness: The patient is a 32 y.o. female G2P1001 at [redacted]w[redacted]d who presents for regular uterine contractions that she rated as mild.  She notes +FM, no LOF, and no VB.  Her pregnancy has been complicated by history of cesarean delivery, obesity, marijuana use.   Hospital Course: the patient was admitted to labor and delivery for observation.  She had normal vital signs. The fetal tracing was reactive.  Her cervical exam remained unchanged over 4 hours and her cervix was closed.  She had a negative urinalysis. Her contractions began to space out. Because she was feeling much less symptomatic and she wasn't laboring, she was discharged home in stable condition.    Past Medical History:  Diagnosis Date  . Heart murmur     Past Surgical History:  Procedure Laterality Date  . CESAREAN SECTION  2007  . LEEP      No current facility-administered medications on file prior to encounter.    Current Outpatient Medications on File Prior to Encounter  Medication Sig Dispense Refill  . cetirizine (ZYRTEC) 5 MG tablet Take 1 tablet (5 mg total) by mouth daily. (Patient not taking: Reported on 06/06/2018) 30 tablet 0  . fluticasone (FLONASE) 50 MCG/ACT nasal spray Place 2 sprays into both nostrils daily. (Patient not taking: Reported on 06/06/2018) 16 g 0  . promethazine (PHENERGAN) 25 MG tablet Take 25 mg by mouth every 6 (six) hours as needed for nausea or vomiting.      No Known Allergies  Social History   Socioeconomic History  . Marital status: Single    Spouse name:  Not on file  . Number of children: Not on file  . Years of education: Not on file  . Highest education level: Not on file  Occupational History  . Not on file  Social Needs  . Financial resource strain: Not on file  . Food insecurity:    Worry: Not on file    Inability: Not on file  . Transportation needs:    Medical: Not on file    Non-medical: Not on file  Tobacco Use  . Smoking status: Former Smoker    Packs/day: 0.50    Types: Cigarettes  . Smokeless tobacco: Never Used  Substance and Sexual Activity  . Alcohol use: Not Currently    Comment: occassional  . Drug use: No    Comment: pt states "smoked marijuana at first of my pregnancy, but then quit when I found out I was pregnant"  . Sexual activity: Yes    Birth control/protection: None  Lifestyle  . Physical activity:    Days per week: Not on file    Minutes per session: Not on file  . Stress: Not on file  Relationships  . Social connections:    Talks on phone: Not on file    Gets together: Not on file    Attends religious service: Not on file    Active member of club or organization: Not on file    Attends meetings of clubs or organizations: Not on file    Relationship status: Not on file  .  Intimate partner violence:    Fear of current or ex partner: Not on file    Emotionally abused: Not on file    Physically abused: Not on file    Forced sexual activity: Not on file  Other Topics Concern  . Not on file  Social History Narrative  . Not on file   Review of Systems: negative unless noted in HPI  Physical Exam: BP 122/70 (BP Location: Right Arm)   Pulse 88   Temp 98 F (36.7 C) (Oral)   Resp 18   Ht 5\' 9"  (1.753 m)   Wt 114.3 kg   LMP 01/23/2018   BMI 37.21 kg/m   Gen: NAD CV: RRR Pulm: CTAB Pelvic: closed/thick/high per RN over multiple checks Ext: no e/c/t  Consults: None  Significant Findings/ Diagnostic Studies:  Lab Results  Component Value Date   APPEARANCEUR CLEAR (A) 09/04/2018    GLUCOSEU NEGATIVE 09/04/2018   BILIRUBINUR NEGATIVE 09/04/2018   KETONESUR 20 (A) 09/04/2018   LABSPEC 1.010 09/04/2018   HGBUR NEGATIVE 09/04/2018   PHURINE 6.0 09/04/2018   NITRITE NEGATIVE 09/04/2018   LEUKOCYTESUR NEGATIVE 09/04/2018   RBCU 0-5 09/04/2018   WBCU 0-5 09/04/2018   BACTERIA NONE SEEN 09/04/2018   EPIU 0-5 09/04/2018     Procedures: NST  Baseline FHR: 125 beats/min Variability: moderate Accelerations: present Decelerations: absent Tocometry: initially Q 5, then spacing out  Interpretation:  INDICATIONS: rule out uterine contractions RESULTS:  A NST procedure was performed with FHR monitoring and a normal baseline established, appropriate time of 20-40 minutes of evaluation, and accels >2 seen w 15x15 characteristics.  Results show a REACTIVE NST.    Discharge Condition: improved   Disposition: Discharge disposition: 01-Home or Self Care       Diet: Regular diet  Discharge Activity: Activity as tolerated   Allergies as of 09/04/2018   No Known Allergies     Medication List    STOP taking these medications   cetirizine 5 MG tablet Commonly known as:  ZYRTEC   fluticasone 50 MCG/ACT nasal spray Commonly known as:  FLONASE     TAKE these medications   promethazine 25 MG tablet Commonly known as:  PHENERGAN Take 25 mg by mouth every 6 (six) hours as needed for nausea or vomiting.      Follow-up Information    Department, [facility] Follow up on 09/04/2018.   Why:  Keep previously scheduled appointment Contact information: [address] [place] [address] [phone]           Total time spent taking care of this patient: 20 minutes  Signed: Thomasene Mohair, MD  09/04/2018, 6:46 AM

## 2018-09-04 NOTE — Discharge Summary (Signed)
See Final Progress note 

## 2018-09-04 NOTE — OB Triage Note (Signed)
Pt presents with contractions that started at 2345 on 10/15, states every 5-6 minutes. Denies lof, vaginal bleeding, baby movement as normal.

## 2018-10-12 ENCOUNTER — Encounter: Payer: Self-pay | Admitting: *Deleted

## 2018-10-12 ENCOUNTER — Observation Stay
Admission: EM | Admit: 2018-10-12 | Discharge: 2018-10-12 | Disposition: A | Discharge status: Home / Self Care | Payer: Medicaid Other | Attending: Obstetrics and Gynecology | Admitting: Obstetrics and Gynecology

## 2018-10-12 DIAGNOSIS — O471 False labor at or after 37 completed weeks of gestation: Secondary | ICD-10-CM | POA: Diagnosis not present

## 2018-10-12 DIAGNOSIS — O479 False labor, unspecified: Secondary | ICD-10-CM | POA: Diagnosis present

## 2018-10-12 DIAGNOSIS — Z3A37 37 weeks gestation of pregnancy: Secondary | ICD-10-CM | POA: Insufficient documentation

## 2018-10-12 NOTE — OB Triage Note (Signed)
Recvd pt from ED. Pt c/o contractions every 9 minutes that started around midnight. Pt took a bath and contractions went away, then started back up when she went to lay in bed. Pt is smiling and talking with SO, but rates pain a 10/10. Feeling baby move well. Denies vaginal bleeding or LOF.

## 2018-10-12 NOTE — Discharge Summary (Signed)
Physician Discharge Summary   Patient ID: Evelyn Stevenson [id number] 32 y.o. 1986-07-22  Admit date: 10/12/2018  Discharge date and time: No discharge date for patient encounter.   Admitting Physician: Adelene Idlerhristanna Marissa Weaver MD  Discharge Physician: Adelene Idlerhristanna Tyller Bowlby MD  Admission Diagnoses: 37 wks preg contractions  Discharge Diagnoses: [redacted] week gestation, braxton hicks contractions  Admission Condition: good  Discharged Condition: good  Indication for Admission:  Labor evaluation  Hospital Course: Patient was observed for over an hour. She had only 4 contractions in that time period. She did not make cervical change. Labor was ruled out. She was discharged home in stable condition.  Consults: None  Significant Diagnostic Studies: none  Treatments: oral hydration  Discharge Exam: BP 125/67   Pulse 80   Temp 98 F (36.7 C) (Oral)   Resp 16   LMP 01/23/2018   General Appearance:    Alert, cooperative, no distress, appears stated age  Head:    Normocephalic, without obvious abnormality, atraumatic  Eyes:    PERRL, conjunctiva/corneas clear, EOM's intact, fundi    benign, both eyes  Ears:    Normal TM's and external ear canals, both ears  Nose:   Nares normal, septum midline, mucosa normal, no drainage    or sinus tenderness  Throat:   Lips, mucosa, and tongue normal; teeth and gums normal  Neck:   Supple, symmetrical, trachea midline, no adenopathy;    thyroid:  no enlargement/tenderness/nodules; no carotid   bruit or JVD  Back:     Symmetric, no curvature, ROM normal, no CVA tenderness  Lungs:     Clear to auscultation bilaterally, respirations unlabored  Chest Wall:    No tenderness or deformity   Heart:    Regular rate and rhythm, S1 and S2 normal, no murmur, rub   or gallop  Breast Exam:    No tenderness, masses, or nipple abnormality  Abdomen:     Soft, non-tender, bowel sounds active all four quadrants,    no masses, no organomegaly  Genitalia:    Normal  female without lesion, discharge or tenderness  Rectal:    Normal tone, normal prostate, no masses or tenderness;   guaiac negative stool  Extremities:   Extremities normal, atraumatic, no cyanosis or edema  Pulses:   2+ and symmetric all extremities  Skin:   Skin color, texture, turgor normal, no rashes or lesions  Lymph nodes:   Cervical, supraclavicular, and axillary nodes normal  Neurologic:   CNII-XII intact, normal strength, sensation and reflexes    throughout    Disposition: Discharge disposition: 01-Home or Self Care       Patient Instructions:  Allergies as of 10/12/2018   No Known Allergies     Medication List    TAKE these medications   promethazine 25 MG tablet Commonly known as:  PHENERGAN Take 25 mg by mouth every 6 (six) hours as needed for nausea or vomiting.      Activity: activity as tolerated Diet: regular diet Wound Care: none needed  Follow-up with Health department in 1 week.  Signed: Natale MilchChristanna R Caige Almeda 10/12/2018 5:51 AM

## 2018-10-12 NOTE — Progress Notes (Signed)
Patient ID: Evelyn Stevenson, female   DOB: 01-08-1986, 32 y.o.   MRN: [id number]  Triage Note  Evelyn BryantShemika L Dismore is an 32 y.o. female.  HPI:  She presents this evening complaining of contractions that are occurring about every 5 minutes. She was examined by nursing and her cervix was closed. She was given oral hydration which she drank. After drinking the water she reported that her contractions resolved. She was observed for an hour and reexamined. Her cervix was closed. She was agreeable for discharge home.   She denies leakage of fluid. Denies vaginal bleeding. Reports good fetal movement. She denies complications of this pregnancy such as diabetes and hypertension.   Past Medical History:  Diagnosis Date  . Heart murmur     Past Surgical History:  Procedure Laterality Date  . CESAREAN SECTION  2007  . LEEP      Family History  Problem Relation Age of Onset  . Hypertension Mother   . Hypertension Father   . Cancer Maternal Grandmother     Social History:  reports that she has quit smoking. Her smoking use included cigarettes. She smoked 0.50 packs per day. She has never used smokeless tobacco. She reports that she drank alcohol. She reports that she does not use drugs.  Allergies: No Known Allergies  Medications: I have reviewed the patient's current medications.  No results found for this or any previous visit (from the past 48 hour(s)).  No results found.  Review of Systems  Constitutional: Negative for chills, fever, malaise/fatigue and weight loss.  HENT: Negative for congestion, hearing loss and sinus pain.   Eyes: Negative for blurred vision and double vision.  Respiratory: Negative for cough, sputum production, shortness of breath and wheezing.   Cardiovascular: Negative for chest pain, palpitations, orthopnea and leg swelling.  Gastrointestinal: Negative for abdominal pain, constipation, diarrhea, nausea and vomiting.  Genitourinary: Negative for dysuria, flank  pain, frequency, hematuria and urgency.  Musculoskeletal: Negative for back pain, falls and joint pain.  Skin: Negative for itching and rash.  Neurological: Negative for dizziness and headaches.  Psychiatric/Behavioral: Negative for depression, substance abuse and suicidal ideas. The patient is not nervous/anxious.    Blood pressure 125/67, pulse 80, temperature 98 F (36.7 C), temperature source Oral, resp. rate 16, last menstrual period 01/23/2018. Physical Exam  Nursing note and vitals reviewed. Constitutional: She is oriented to person, place, and time. She appears well-developed and well-nourished.  HENT:  Head: Normocephalic and atraumatic.  Cardiovascular: Normal rate and regular rhythm.  Respiratory: Effort normal and breath sounds normal.  GI: Soft. Bowel sounds are normal.  Musculoskeletal: Normal range of motion.  Neurological: She is alert and oriented to person, place, and time.  Skin: Skin is warm and dry.  Psychiatric: She has a normal mood and affect. Her behavior is normal. Judgment and thought content normal.    Assessment/Plan: 32 yo G2P1001 1631w0d 1. Braxton hicks Contractions resolved with oral hydration. Will discharge patient home. She has a planned cesarean for 10/29/18. Plan to follow up in 1 week with the health department.    Harvey Matlack R Starsky Nanna 10/12/2018, 5:38 AM

## 2018-10-12 NOTE — Discharge Instructions (Signed)
Come back if: ° °Big gush of fluids °Decreased fetal movement °Temp over 100.4 °Heavy vaginal bleeding °Contractions every 3-5 min lasting at least one hour ° °Get plenty of rest and stay well hydrated! °

## 2018-10-26 ENCOUNTER — Inpatient Hospital Stay
Admission: EM | Admit: 2018-10-26 | Discharge: 2018-10-28 | DRG: 806 | Disposition: A | Discharge status: Home / Self Care | Payer: Medicaid Other | Attending: Obstetrics and Gynecology | Admitting: Obstetrics and Gynecology

## 2018-10-26 ENCOUNTER — Other Ambulatory Visit: Payer: Self-pay

## 2018-10-26 DIAGNOSIS — Z98891 History of uterine scar from previous surgery: Secondary | ICD-10-CM

## 2018-10-26 DIAGNOSIS — O99214 Obesity complicating childbirth: Secondary | ICD-10-CM | POA: Diagnosis present

## 2018-10-26 DIAGNOSIS — Z87891 Personal history of nicotine dependence: Secondary | ICD-10-CM

## 2018-10-26 DIAGNOSIS — O99324 Drug use complicating childbirth: Secondary | ICD-10-CM | POA: Diagnosis present

## 2018-10-26 DIAGNOSIS — O34219 Maternal care for unspecified type scar from previous cesarean delivery: Secondary | ICD-10-CM | POA: Diagnosis present

## 2018-10-26 DIAGNOSIS — Z3A39 39 weeks gestation of pregnancy: Secondary | ICD-10-CM

## 2018-10-26 DIAGNOSIS — O34211 Maternal care for low transverse scar from previous cesarean delivery: Secondary | ICD-10-CM | POA: Diagnosis not present

## 2018-10-26 DIAGNOSIS — E669 Obesity, unspecified: Secondary | ICD-10-CM | POA: Diagnosis present

## 2018-10-26 DIAGNOSIS — F1290 Cannabis use, unspecified, uncomplicated: Secondary | ICD-10-CM | POA: Diagnosis present

## 2018-10-26 DIAGNOSIS — O9081 Anemia of the puerperium: Secondary | ICD-10-CM | POA: Diagnosis not present

## 2018-10-26 DIAGNOSIS — D62 Acute posthemorrhagic anemia: Secondary | ICD-10-CM | POA: Diagnosis not present

## 2018-10-26 LAB — TYPE AND SCREEN
ABO/RH(D): O POS
ANTIBODY SCREEN: NEGATIVE

## 2018-10-26 LAB — CBC
HCT: 39.8 % (ref 36.0–46.0)
Hemoglobin: 13.7 g/dL (ref 12.0–15.0)
MCH: 32.2 pg (ref 26.0–34.0)
MCHC: 34.4 g/dL (ref 30.0–36.0)
MCV: 93.4 fL (ref 80.0–100.0)
Platelets: 230 10*3/uL (ref 150–400)
RBC: 4.26 MIL/uL (ref 3.87–5.11)
RDW: 14.3 % (ref 11.5–15.5)
WBC: 11.7 10*3/uL — ABNORMAL HIGH (ref 4.0–10.5)
nRBC: 0 % (ref 0.0–0.2)

## 2018-10-26 LAB — RAPID HIV SCREEN (HIV 1/2 AB+AG)
HIV 1/2 Antibodies: NONREACTIVE
HIV-1 P24 Antigen - HIV24: NONREACTIVE

## 2018-10-26 MED ORDER — METHYLERGONOVINE MALEATE 0.2 MG PO TABS
0.2000 mg | ORAL_TABLET | Freq: Three times a day (TID) | ORAL | Status: DC
  Administered 2018-10-26 – 2018-10-28 (×4): 0.2 mg via ORAL
  Filled 2018-10-26 (×4): qty 1

## 2018-10-26 MED ORDER — OXYTOCIN 40 UNITS IN LACTATED RINGERS INFUSION - SIMPLE MED
INTRAVENOUS | Status: AC
  Administered 2018-10-26: 16:00:00
  Filled 2018-10-26: qty 1000

## 2018-10-26 MED ORDER — SOD CITRATE-CITRIC ACID 500-334 MG/5ML PO SOLN
ORAL | Status: AC
  Filled 2018-10-26: qty 15

## 2018-10-26 MED ORDER — FERROUS SULFATE 325 (65 FE) MG PO TABS
325.0000 mg | ORAL_TABLET | Freq: Two times a day (BID) | ORAL | Status: DC
  Administered 2018-10-27 – 2018-10-28 (×3): 325 mg via ORAL
  Filled 2018-10-26 (×3): qty 1

## 2018-10-26 MED ORDER — OXYTOCIN 10 UNIT/ML IJ SOLN
INTRAMUSCULAR | Status: AC
  Administered 2018-10-26: 10 [IU] via INTRAMUSCULAR
  Filled 2018-10-26: qty 2

## 2018-10-26 MED ORDER — FAMOTIDINE 40 MG/5ML PO SUSR
40.0000 mg | ORAL | Status: AC
  Administered 2018-10-26: 40 mg via ORAL
  Filled 2018-10-26: qty 5

## 2018-10-26 MED ORDER — COCONUT OIL OIL
1.0000 "application " | TOPICAL_OIL | Status: DC | PRN
  Administered 2018-10-27: 1 via TOPICAL
  Filled 2018-10-26: qty 120

## 2018-10-26 MED ORDER — METHYLERGONOVINE MALEATE 0.2 MG/ML IJ SOLN
INTRAMUSCULAR | Status: AC
  Administered 2018-10-26: 0.2 mg via INTRAMUSCULAR
  Filled 2018-10-26: qty 1

## 2018-10-26 MED ORDER — LIDOCAINE HCL (PF) 1 % IJ SOLN
INTRAMUSCULAR | Status: AC
  Administered 2018-10-26: 30 mL
  Filled 2018-10-26: qty 30

## 2018-10-26 MED ORDER — IBUPROFEN 600 MG PO TABS
600.0000 mg | ORAL_TABLET | Freq: Four times a day (QID) | ORAL | Status: DC
  Administered 2018-10-26 – 2018-10-28 (×7): 600 mg via ORAL
  Filled 2018-10-26 (×7): qty 1

## 2018-10-26 MED ORDER — OXYTOCIN 40 UNITS IN LACTATED RINGERS INFUSION - SIMPLE MED
INTRAVENOUS | Status: AC
  Filled 2018-10-26: qty 1000

## 2018-10-26 MED ORDER — SIMETHICONE 80 MG PO CHEW: 80.0000 mg | CHEWABLE_TABLET | ORAL | Status: DC | PRN

## 2018-10-26 MED ORDER — SOD CITRATE-CITRIC ACID 500-334 MG/5ML PO SOLN: 30.0000 mL | ORAL | Status: DC

## 2018-10-26 MED ORDER — DIPHENHYDRAMINE HCL 25 MG PO CAPS: 25.0000 mg | ORAL_CAPSULE | Freq: Four times a day (QID) | ORAL | Status: DC | PRN

## 2018-10-26 MED ORDER — FENTANYL CITRATE (PF) 100 MCG/2ML IJ SOLN
INTRAMUSCULAR | Status: AC
  Administered 2018-10-26: 50 ug
  Filled 2018-10-26: qty 2

## 2018-10-26 MED ORDER — ONDANSETRON HCL 4 MG/2ML IJ SOLN: 4.0000 mg | INTRAMUSCULAR | Status: DC | PRN

## 2018-10-26 MED ORDER — MISOPROSTOL 200 MCG PO TABS
ORAL_TABLET | ORAL | Status: AC
  Filled 2018-10-26: qty 4

## 2018-10-26 MED ORDER — OXYTOCIN 10 UNIT/ML IJ SOLN
INTRAMUSCULAR | Status: AC
  Filled 2018-10-26: qty 1

## 2018-10-26 MED ORDER — CEFAZOLIN SODIUM-DEXTROSE 2-4 GM/100ML-% IV SOLN
2.0000 g | INTRAVENOUS | Status: DC
  Filled 2018-10-26: qty 100

## 2018-10-26 MED ORDER — BUPIVACAINE HCL (PF) 0.5 % IJ SOLN: 5.0000 mL | Freq: Once | INTRAMUSCULAR | Status: DC

## 2018-10-26 MED ORDER — AMMONIA AROMATIC IN INHA
RESPIRATORY_TRACT | Status: AC
  Filled 2018-10-26: qty 10

## 2018-10-26 MED ORDER — BENZOCAINE-MENTHOL 20-0.5 % EX AERO
1.0000 "application " | INHALATION_SPRAY | CUTANEOUS | Status: DC | PRN
  Administered 2018-10-28: 1 via TOPICAL
  Filled 2018-10-26: qty 56

## 2018-10-26 MED ORDER — SENNOSIDES-DOCUSATE SODIUM 8.6-50 MG PO TABS
2.0000 | ORAL_TABLET | ORAL | Status: DC
  Administered 2018-10-26 – 2018-10-27 (×2): 2 via ORAL
  Filled 2018-10-26 (×3): qty 2

## 2018-10-26 MED ORDER — HYDROCODONE-ACETAMINOPHEN 5-325 MG PO TABS
1.0000 | ORAL_TABLET | ORAL | Status: DC | PRN
  Administered 2018-10-26: 1 via ORAL
  Filled 2018-10-26: qty 1

## 2018-10-26 MED ORDER — METHYLERGONOVINE MALEATE 0.2 MG/ML IJ SOLN
0.2000 mg | Freq: Once | INTRAMUSCULAR | Status: AC
  Administered 2018-10-26: 0.2 mg via INTRAMUSCULAR

## 2018-10-26 MED ORDER — ACETAMINOPHEN 325 MG PO TABS
650.0000 mg | ORAL_TABLET | ORAL | Status: DC | PRN
  Administered 2018-10-27 (×3): 650 mg via ORAL
  Filled 2018-10-26 (×3): qty 2

## 2018-10-26 MED ORDER — PRENATAL MULTIVITAMIN CH
1.0000 | ORAL_TABLET | Freq: Every day | ORAL | Status: DC
  Administered 2018-10-27 – 2018-10-28 (×2): 1 via ORAL
  Filled 2018-10-26 (×2): qty 1

## 2018-10-26 MED ORDER — BUPIVACAINE 0.25 % ON-Q PUMP DUAL CATH 400 ML
400.0000 mL | INJECTION | Status: DC
  Filled 2018-10-26: qty 400

## 2018-10-26 MED ORDER — WITCH HAZEL-GLYCERIN EX PADS: 1.0000 "application " | MEDICATED_PAD | CUTANEOUS | Status: DC | PRN

## 2018-10-26 MED ORDER — ONDANSETRON HCL 4 MG PO TABS: 4.0000 mg | ORAL_TABLET | ORAL | Status: DC | PRN

## 2018-10-26 MED ORDER — DIBUCAINE 1 % RE OINT: 1.0000 "application " | TOPICAL_OINTMENT | RECTAL | Status: DC | PRN

## 2018-10-26 NOTE — H&P (Signed)
OB History & Physical   History of Present Illness:  Chief Complaint: contractions  HPI:  Evelyn Stevenson is a 32 y.o. 462P1001 female at 6167w0d dated by LMP consistent with 28 week ultrasound.  Her pregnancy has been complicated by history of cesarean delivery (she was scheduled to undergo repeat cesarean delivery in 3 days), initially a twin gestation with early loss of twin A, marijuana use, obesity with BMI 33.    She reports contractions.   She denies leakage of fluid.   She denies vaginal bleeding.   She reports fetal movement.    Maternal Medical History:   Past Medical History:  Diagnosis Date  . Heart murmur     Past Surgical History:  Procedure Laterality Date  . CESAREAN SECTION  2007  . LEEP      No Known Allergies  Prior to Admission medications   Not on File    OB History  Gravida Para Term Preterm AB Living  2 1 1     1   SAB TAB Ectopic Multiple Live Births               # Outcome Date GA Lbr Len/2nd Weight Sex Delivery Anes PTL Lv  2 Current           1 Term             Prenatal care site: [facility]  Social History: She  reports that she has quit smoking. Her smoking use included cigarettes. She smoked 0.50 packs per day. She has never used smokeless tobacco. She reports that she drank alcohol. She reports that she does not use drugs.  Family History: family history includes Cancer in her maternal grandmother; Hypertension in her father and mother.   Review of Systems:  Review of Systems  Constitutional: Negative.   HENT: Negative.   Eyes: Negative.   Respiratory: Negative.   Cardiovascular: Negative.   Gastrointestinal: Positive for abdominal pain (contractions, see HPI). Negative for blood in stool, constipation, diarrhea, heartburn, melena, nausea and vomiting.  Genitourinary: Negative.   Musculoskeletal: Negative.   Skin: Negative.   Neurological: Negative.   Psychiatric/Behavioral: Negative.      Physical Exam:  Vital Signs: BP 129/74    Pulse 73   Temp 98 F (36.7 C) (Oral)   LMP 01/23/2018  Physical Exam  Constitutional: She is oriented to person, place, and time. She appears well-developed and well-nourished. No distress.  HENT:  Head: Normocephalic and atraumatic.  Eyes: Conjunctivae are normal. No scleral icterus.  Cardiovascular: Normal rate and regular rhythm.  Pulmonary/Chest: Effort normal and breath sounds normal. No respiratory distress. She has no wheezes.  Abdominal: Soft. Bowel sounds are normal. She exhibits no distension.  Gravid, NT  Musculoskeletal: Normal range of motion. She exhibits no edema.  Neurological: She is alert and oriented to person, place, and time. No cranial nerve deficit.  Skin: Skin is warm and dry. No erythema.  Psychiatric: She has a normal mood and affect. Her behavior is normal. Judgment normal.   Pelvic: patient delivered precipitously.     Pertinent Results:  Prenatal Labs: Blood type/Rh O positive  Antibody screen negative  Rubella Immune  Varicella Immune    RPR NR  HBsAg negative  HIV negative  GC negative  Chlamydia negative  Genetic screening First trimester screen - neg  1 hour GTT 59  3 hour GTT n/a  GBS negative on negative within past 5 weeks   Baseline FHR: 120 beats/min   Variability:  moderate   Accelerations: present   Decelerations: absent Contractions: present frequency: 2-3 q 10 min Overall assessment: cat 1   Assessment:  Evelyn Stevenson is a 32 y.o. G49P1001 female at [redacted]w[redacted]d with precipitous labor.   Plan:  1. Admit to Labor & Delivery  2. Patient initially presented with contractions with no cervical change. She rapidly progressed to complete and delivered.  See delivery note.    Thomasene Mohair, MD 10/26/2018 4:00 PM

## 2018-10-26 NOTE — Lactation Note (Addendum)
This note was copied from a baby's chart. Lactation Consultation Note  Patient Name: Evelyn Stevenson LaShemika Mittelstadt ZOXWR'UToday's Date: 10/26/2018 Reason for consult: Initial assessment  Mom undecided about whether she wants to breast feed or formula feed.  Initially did not even want to leave [name] skin to skin.  After his temperature was low, mom agreed to put him skin to skin.  Mom has flat nipples.  Right nipple still has nipple ring imbedded into skin with mom being unable to take it out.  Mom was complaining of severe cramping at first.  Once medicine started to take affect, mom [facility] [name] on the breast.  For now mom wants to breast feed on the left breast and may pump on right breast.  Assisted mom in comfortable position with pillow support.  Demonstrated hand expression and sandwiching the breast to get as deep a latch as possible.  Assisted mom with latching [name] to left breast.  As soon as he latched mom could feel strong tug and voiced that she did not know if she could keep experiencing this nipple pain.  A few minutes into the sucking, the pain eased up and she felt she could tolerate him remaining on the breast.  He came off a few times and we had to re latch, but he continued to stay at the breast for 12 minutes.  Mom did not breast feed other child who is now 32 years old.  Reviewed supply and demand, normal course of lactation and routine newborn feeding pattern.  Maternal Data Formula Feeding for Exclusion: No Has patient been taught Hand Expression?: Yes Does the patient have breastfeeding experience prior to this delivery?: No(Did not breast feed now 32 year old)  Feeding Feeding Type: Breast Fed  LATCH Score Latch: Repeated attempts needed to sustain latch, nipple held in mouth throughout feeding, stimulation needed to elicit sucking reflex.  Audible Swallowing: None  Type of Nipple: Flat  Comfort (Breast/Nipple): Soft / non-tender  Hold (Positioning):  Assistance needed to correctly position infant at breast and maintain latch.  LATCH Score: 5  Interventions Interventions: Breast feeding basics reviewed;Position options;Assisted with latch;Reverse pressure;Skin to skin;Breast compression;Breast massage;Adjust position;Support pillows;Hand express  Lactation Tools Discussed/Used WIC Program: Yes   Consult Status Consult Status: Follow-up Follow-up type: Call as needed    [name], Onnie Hatchel Kay 10/26/2018, 6:27 PM

## 2018-10-26 NOTE — OB Triage Note (Signed)
Patient here for contractions since 5 am, she states that when they woke her up they were around 10 minutes apart and have gotten more regular. When she has the contractions she gets dizzy. Denies bleeding or LOF, states that she is scheduled for a c section on Tuesday of this coming week.

## 2018-10-27 ENCOUNTER — Encounter: Payer: Self-pay | Admitting: Certified Nurse Midwife

## 2018-10-27 DIAGNOSIS — O34219 Maternal care for unspecified type scar from previous cesarean delivery: Secondary | ICD-10-CM | POA: Diagnosis not present

## 2018-10-27 LAB — CBC
HCT: 35.6 % — ABNORMAL LOW (ref 36.0–46.0)
Hemoglobin: 12.3 g/dL (ref 12.0–15.0)
MCH: 32.1 pg (ref 26.0–34.0)
MCHC: 34.6 g/dL (ref 30.0–36.0)
MCV: 93 fL (ref 80.0–100.0)
Platelets: 220 10*3/uL (ref 150–400)
RBC: 3.83 MIL/uL — ABNORMAL LOW (ref 3.87–5.11)
RDW: 14.2 % (ref 11.5–15.5)
WBC: 17.5 10*3/uL — ABNORMAL HIGH (ref 4.0–10.5)
nRBC: 0 % (ref 0.0–0.2)

## 2018-10-27 NOTE — Lactation Note (Signed)
This note was copied from a baby's chart. Lactation Consultation Note  Patient Name: Evelyn Stevenson ZOXWR'UToday's Date: 10/27/2018 Reason for consult: Follow-up assessment;Term;Other (Comment)(Nipple barbell embedded on right nipple & left nipple flat)  Mom does not want to use nipple shield any longer.  Discussed pumping to stimulate right breast with nipple piercing with barbell imbedded and left breast to evert nipple for deeper latch without nipple shield.  [facility] set up with cleaning supplies and reviewed pumping, collection, storage, labeling and handling of expressed colostrum/breast milk.  After pre pumping, mom's left nipple was everted for easy latch.  [name] nursed on right nipple for 7 minutes.  3 ml of expressed colostrum given via spoon feed after breast feed.  Lactation name and number written on white board and encouraged to call with any questions, concerns or assistance.   Maternal Data Formula Feeding for Exclusion: No Reason for exclusion: Mother's choice to formula and breast feed on admission Has patient been taught Hand Expression?: Yes Does the patient have breastfeeding experience prior to this delivery?: No(Did not breast feed 32 year old)  Feeding Feeding Type: Breast Fed  LATCH Score Latch: Repeated attempts needed to sustain latch, nipple held in mouth throughout feeding, stimulation needed to elicit sucking reflex.  Audible Swallowing: A few with stimulation  Type of Nipple: Flat(Embedded piercing on right nipple & flattish left nipple )  Comfort (Breast/Nipple): Soft / non-tender  Hold (Positioning): Assistance needed to correctly position infant at breast and maintain latch.  LATCH Score: 6  Interventions Interventions: Assisted with latch;Pre-pump if needed;Position options;Reverse pressure;Skin to skin;Breast compression;Coconut oil;Breast massage;Adjust position;Support pillows  Lactation Tools Discussed/Used Tools: Pump;Coconut oil;Other  (comment)(Spoon fed expressed colostrum) Breast pump type: Double-Electric Breast Pump WIC Program: Yes Pump Review: Setup, frequency, and cleaning;Milk Storage;Other (comment) Initiated by:: S.Derrall Hicks,RN,BSN,IBCLC Date initiated:: 10/27/18   Consult Status Consult Status: Follow-up Date: 10/27/18 Follow-up type: Call as needed    [name], [name] 10/27/2018, 1:46 PM

## 2018-10-27 NOTE — Progress Notes (Signed)
Obstetric Postpartum Daily Progress Note Subjective:  32 y.o. G2P1001 postpartum day #1 status post successful vaginal birth after cesarean section.  She is ambulating, is tolerating po, is voiding spontaneously.  Her pain is well controlled on PO pain medications. Her lochia is less than menses.   Medications SCHEDULED MEDICATIONS  . ferrous sulfate  325 mg Oral BID WC  . ibuprofen  600 mg Oral Q6H  . methylergonovine  0.2 mg Oral TID  . prenatal multivitamin  1 tablet Oral Q1200  . senna-docusate  2 tablet Oral Q24H    MEDICATION INFUSIONS    PRN MEDICATIONS  acetaminophen, benzocaine-Menthol, coconut oil, witch hazel-glycerin **AND** dibucaine, diphenhydrAMINE, HYDROcodone-acetaminophen, ondansetron **OR** ondansetron (ZOFRAN) IV, simethicone    Objective:   Vitals:   10/26/18 2029 10/26/18 2349 10/27/18 0339 10/27/18 0803  BP: 133/72 124/64 (!) 154/89 115/80  Pulse: 79 81 74 79  Resp: 18 20 18 20   Temp: 98.5 F (36.9 C) 97.7 F (36.5 C) 98.4 F (36.9 C) 98.7 F (37.1 C)  TempSrc: Oral Oral Oral Axillary  SpO2: 98% 100% 98% 98%  Weight:      Height:        Current Vital Signs 24h Vital Sign Ranges  T 98.7 F (37.1 C) Temp  Avg: 98.2 F (36.8 C)  Min: 97.7 F (36.5 C)  Max: 98.7 F (37.1 C)  BP 115/80 BP  Min: 115/80  Max: 154/89  HR 79 Pulse  Avg: 79.2  Min: 74  Max: 82  RR 20 Resp  Avg: 19  Min: 18  Max: 20  SaO2 98 % Room Air SpO2  Avg: 98.8 %  Min: 98 %  Max: 100 %       24 Hour I/O Current Shift I/O  Time Ins Outs 12/07 0701 - 12/08 0700 In: 972.9 [I.V.:972.9] Out: 1630 [Urine:950] No intake/output data recorded.  General: NAD Pulmonary: no increased work of breathing Abdomen: non-distended, non-tender, fundus firm at level of umbilicus Extremities: no edema, no erythema, no tenderness  Labs:  Recent Labs  Lab 10/26/18 1534 10/27/18 0529  WBC 11.7* 17.5*  HGB 13.7 12.3  HCT 39.8 35.6*  PLT 230 220     Assessment:   32 y.o. G2P1001  postpartum day # 1 status post successful VBAC  Plan:   1) Acute blood loss anemia - hemodynamically stable and asymptomatic - po ferrous sulfate  2) O POS / Rubella  / Varicella Immune  3) TDAP status received 9/26/219, influenza vaccine: declined  4) breast (mostly) and formula /Contraception = still consider BTL, but would like Depo provera for now  5) Disposition: home tomorrow  Thomasene MohairStephen Jann Milkovich, MD 10/27/2018 11:11 AM

## 2018-10-28 ENCOUNTER — Encounter: Payer: Medicaid Other | Admitting: Obstetrics and Gynecology

## 2018-10-28 ENCOUNTER — Inpatient Hospital Stay: Admission: RE | Admit: 2018-10-28 | Payer: Medicaid Other | Source: Ambulatory Visit

## 2018-10-28 LAB — RPR: RPR Ser Ql: NONREACTIVE

## 2018-10-28 MED ORDER — IBUPROFEN 600 MG PO TABS: 600.0000 mg | ORAL_TABLET | Freq: Four times a day (QID) | ORAL | 0 refills | Status: DC | PRN

## 2018-10-28 MED ORDER — MEDROXYPROGESTERONE ACETATE 150 MG/ML IM SUSP: INTRAMUSCULAR | 3 refills | Status: DC

## 2018-10-28 MED ORDER — PRENATAL VITAMINS 0.8 MG PO TABS: 1.0000 | ORAL_TABLET | Freq: Every day | ORAL | 12 refills | Status: DC

## 2018-10-28 NOTE — Discharge Summary (Addendum)
Physician Obstetric Discharge Summary  Patient ID: Evelyn Stevenson MRN: [id number] DOB/AGE: 1986/03/30 32 y.o.   Date of Admission: 10/26/2018 Date of Delivery: 10/26/2018 Date of Discharge: 10/28/2018  Admitting Diagnosis: Onset of Labor at 4363w0d  Secondary Diagnosis: Previous Cesarean section  Mode of Delivery: Vaginal birth after cesarean section 10/26/2018      Discharge Diagnosis: Term intrauterine pregnancy, delivered. Vaginal birth after Cesarean section. Precipitous labor   Intrapartum Procedures: Repair of first degree perineal laceration   Post partum procedures: none  Complications: Uterine atony   Brief Hospital Course  Evelyn BryantShemika L Rentz is a [id number] who had a VBAC on 10/26/2018 after a precipitous labor;  for further details of this delivery, please refer to the delivery note.  Patient had an uncomplicated postpartum course.  By time of discharge on PPD#2, her pain was controlled on oral pain medications; she had appropriate lochia and was ambulating, voiding without difficulty and tolerating regular diet.  She was deemed stable for discharge to home.     Labs: CBC Latest Ref Rng & Units 10/27/2018 10/26/2018  WBC 4.0 - 10.5 K/uL 17.5(H) 11.7(H)  Hemoglobin 12.0 - 15.0 g/dL 14.712.3 82.913.7  Hematocrit 56.236.0 - 46.0 % 35.6(L) 39.8  Platelets 150 - 400 K/uL 220 230   O POS/ RI/ VI  Physical exam:  Blood pressure 110/60, pulse 70, temperature 98.3 F (36.8 C), temperature source Oral, resp. rate 20, height 5\' 9"  (1.753 m), weight 118 kg, last menstrual period 01/23/2018, SpO2 100 %, unknown if currently breastfeeding. General: alert and no distress Lochia: appropriate Abdomen: soft, NT Uterine Fundus: firm at U/ ML/ NT Extremities: No evidence of DVT seen on physical exam. No lower extremity edema.  Discharge Instructions: Per After Visit Summary. Activity: Advance as tolerated. Pelvic rest for 6 weeks.  Also refer to Discharge Instructions Diet:  Regular Medications: Allergies as of 10/28/2018   No Known Allergies     Medication List    TAKE these medications   ibuprofen 600 MG tablet Commonly known as:  ADVIL,MOTRIN Take 1 tablet (600 mg total) by mouth every 6 (six) hours as needed for mild pain, moderate pain or cramping.   medroxyPROGESTERone 150 MG/ML injection Commonly known as:  DEPO-PROVERA Inject 150 mgm IM once. To bring with her to her 4 week postpartum check up.   Prenatal Vitamins 0.8 MG tablet Take 1 tablet by mouth daily.      Outpatient follow up: To make appointment with Dr Jean RosenthalJackson in 4 weeks for a postpartum visit and to schedule interval tubal and to discuss removing right nipple ring during surgery Postpartum contraception: desires interval tubal with a Depo Provera bridge. Will bring Depo with her to 4 week appointment  Discharged Condition: stable  Discharged to: home   Newborn Data: Baby boy [name] Disposition:home with mother  Apgars: APGAR (1 MIN): 8   APGAR (5 MINS): 9   APGAR (10 MINS):    Baby Feeding: Breast  Farrel ConnersColleen Peyson Delao, CNM 10/28/2018 10:15 AM

## 2018-10-28 NOTE — Progress Notes (Signed)
Patient discharged home with infant. Discharge instructions, prescriptions and follow up appointment given to and reviewed with patient. Patient verbalized understanding. Patient wheeled out with infant by auxiliary.  

## 2018-10-28 NOTE — Discharge Instructions (Signed)
Breast Pumping Tips If you are breastfeeding, there may be times when you cannot feed your baby directly. Returning to work or going on a trip are examples. Pumping allows you to store breast milk and feed it to your baby later. You may not get much milk when you first start to pump. Your breasts should start to make more after a few days. If you pump at the times you usually feed your baby, you may be able to keep making enough milk to feed your baby without also using formula. The more often you pump, the more milk your body will make. When should I pump?  You can start to pump soon after you have your baby. Ask your doctor what is right for you and your baby.  If you are going back to work, start pumping a few weeks before. This gives you time to learn how to pump and to store a supply of milk.  When you are with your baby, feed your baby when he or she is hungry. Pump after each feeding.  When you are away from your baby for many hours, pump for about 15 minutes every 2-3 hours. Pump both breasts at the same time if you can.  If your baby has a formula feeding, make sure to pump close to the same time.  If you drink any alcohol, wait 2 hours before pumping. How do I get ready to pump? Your let-down reflex is your body's natural reaction that makes your breast milk flow. It is easier to make your breast milk flow when you are relaxed. Try these things to help you relax:  Smell one of your baby's blankets or an item of clothing.  Look at a picture or video of your baby.  Sit in a quiet, private space.  Massage the breast you plan to pump.  Place soothing warmth on the breast.  Play relaxing music.  What are some breast pumping tips?  Wash your hands before you pump. You do not need to wash your nipples or breasts.  There are three ways to pump. You can: ? Use your hand to massage and squeeze your breast. ? Use a handheld manual pump. ? Use an electric pump.  Make sure  the suction cup on the breast pump is the right size. Place the suction cup directly over the nipple. It can be painful or hurt your nipple if it is the wrong size or placed wrong.  Put a small amount of purified or modified lanolin on your nipple and areola if you are sore.  If you are using an electric pump, change the speed and suction power to be more comfortable.  You may need a different type of pump if pumping hurts or you do not get a lot of milk. Your doctor can help you pick what type of pump to use.  Keep a full water bottle near you always. Drinking lots of fluid helps you make more milk.  You can store your milk to use later. Pumped breast milk can be stored in a sealable, sterile container or plastic bag. Always put the date you pumped it on the container. ? Milk can stay out at room temperature for up to 8 hours. ? You can store your milk in the refrigerator for up to 8 days. ? You can store your milk in the freezer for 3 months. Thaw frozen milk using warm water. Do not put it in the microwave.  Do not smoke.  Ask your doctor for help. When should I call my doctor?  You have a hard time pumping.  You are worried you do not make enough milk.  You have nipple pain, soreness, or redness.  You want to take birth control pills. This information is not intended to replace advice given to you by your health care provider. Make sure you discuss any questions you have with your health care provider. Document Released: 04/24/2008 Document Revised: 04/13/2016 Document Reviewed: 08/29/2013 Elsevier Interactive Patient Education  2017 [employer]   Breastfeeding Choosing to breastfeed is one of the best decisions you can make for yourself and your baby. A change in hormones during pregnancy causes your breasts to make breast milk in your milk-producing glands. Hormones prevent breast milk from being released before your baby is born. They also prompt milk flow after birth. Once  breastfeeding has begun, thoughts of your baby, as well as his or her sucking or crying, can stimulate the release of milk from your milk-producing glands. Benefits of breastfeeding Research shows that breastfeeding offers many health benefits for infants and mothers. It also offers a cost-free and convenient way to feed your baby. For your baby  Your first milk (colostrum) helps your baby's digestive system to function better.  Special cells in your milk (antibodies) help your baby to fight off infections.  Breastfed babies are less likely to develop asthma, allergies, obesity, or type 2 diabetes. They are also at lower risk for sudden infant death syndrome (SIDS).  Nutrients in breast milk are better able to meet your babys needs compared to infant formula.  Breast milk improves your baby's brain development. For you  Breastfeeding helps to create a very special bond between you and your baby.  Breastfeeding is convenient. Breast milk costs nothing and is always available at the correct temperature.  Breastfeeding helps to burn calories. It helps you to lose the weight that you gained during pregnancy.  Breastfeeding makes your uterus return faster to its size before pregnancy. It also slows bleeding (lochia) after you give birth.  Breastfeeding helps to lower your risk of developing type 2 diabetes, osteoporosis, rheumatoid arthritis, cardiovascular disease, and breast, ovarian, uterine, and endometrial cancer later in life. Breastfeeding basics Starting breastfeeding  Find a comfortable place to sit or lie down, with your neck and back well-supported.  Place a pillow or a rolled-up blanket under your baby to bring him or her to the level of your breast (if you are seated). Nursing pillows are specially designed to help support your arms and your baby while you breastfeed.  Make sure that your baby's tummy (abdomen) is facing your abdomen.  Gently massage your breast. With your  fingertips, massage from the outer edges of your breast inward toward the nipple. This encourages milk flow. If your milk flows slowly, you may need to continue this action during the feeding.  Support your breast with 4 fingers underneath and your thumb above your nipple (make the letter "C" with your hand). Make sure your fingers are well away from your nipple and your babys mouth.  Stroke your baby's lips gently with your finger or nipple.  When your baby's mouth is open wide enough, quickly bring your baby to your breast, placing your entire nipple and as much of the areola as possible into your baby's mouth. The areola is the colored area around your nipple. ? More areola should be visible above your baby's upper lip than below the lower lip. ? Your  baby's lips should be opened and extended outward (flanged) to ensure an adequate, comfortable latch. ? Your baby's tongue should be between his or her lower gum and your breast.  Make sure that your baby's mouth is correctly positioned around your nipple (latched). Your baby's lips should create a seal on your breast and be turned out (everted).  It is common for your baby to suck about 2-3 minutes in order to start the flow of breast milk. Latching Teaching your baby how to latch onto your breast properly is very important. An improper latch can cause nipple pain, decreased milk supply, and poor weight gain in your baby. Also, if your baby is not latched onto your nipple properly, he or she may swallow some air during feeding. This can make your baby fussy. Burping your baby when you switch breasts during the feeding can help to get rid of the air. However, teaching your baby to latch on properly is still the best way to prevent fussiness from swallowing air while breastfeeding. Signs that your baby has successfully latched onto your nipple  Silent tugging or silent sucking, without causing you pain. Infant's lips should be extended outward  (flanged).  Swallowing heard between every 3-4 sucks once your milk has started to flow (after your let-down milk reflex occurs).  Muscle movement above and in front of his or her ears while sucking.  Signs that your baby has not successfully latched onto your nipple  Sucking sounds or smacking sounds from your baby while breastfeeding.  Nipple pain.  If you think your baby has not latched on correctly, slip your finger into the corner of your babys mouth to break the suction and place it between your baby's gums. Attempt to start breastfeeding again. Signs of successful breastfeeding Signs from your baby  Your baby will gradually decrease the number of sucks or will completely stop sucking.  Your baby will fall asleep.  Your baby's body will relax.  Your baby will retain a small amount of milk in his or her mouth.  Your baby will let go of your breast by himself or herself.  Signs from you  Breasts that have increased in firmness, weight, and size 1-3 hours after feeding.  Breasts that are softer immediately after breastfeeding.  Increased milk volume, as well as a change in milk consistency and color by the fifth day of breastfeeding.  Nipples that are not sore, cracked, or bleeding.  Signs that your baby is getting enough milk  Wetting at least 1-2 diapers during the first 24 hours after birth.  Wetting at least 5-6 diapers every 24 hours for the first week after birth. The urine should be clear or pale yellow by the age of 5 days.  Wetting 6-8 diapers every 24 hours as your baby continues to grow and develop.  At least 3 stools in a 24-hour period by the age of 5 days. The stool should be soft and yellow.  At least 3 stools in a 24-hour period by the age of 7 days. The stool should be seedy and yellow.  No loss of weight greater than 10% of birth weight during the first 3 days of life.  Average weight gain of 4-7 oz (113-198 g) per week after the age of 4  days.  Consistent daily weight gain by the age of 5 days, without weight loss after the age of 2 weeks. After a feeding, your baby may spit up a small amount of milk. This is  normal. Breastfeeding frequency and duration Frequent feeding will help you make more milk and can prevent sore nipples and extremely full breasts (breast engorgement). Breastfeed when you feel the need to reduce the fullness of your breasts or when your baby shows signs of hunger. This is called "breastfeeding on demand." Signs that your baby is hungry include:  Increased alertness, activity, or restlessness.  Movement of the head from side to side.  Opening of the mouth when the corner of the mouth or cheek is stroked (rooting).  Increased sucking sounds, smacking lips, cooing, sighing, or squeaking.  Hand-to-mouth movements and sucking on fingers or hands.  Fussing or crying.  Avoid introducing a pacifier to your baby in the first 4-6 weeks after your baby is born. After this time, you may choose to use a pacifier. Research has shown that pacifier use during the first year of a baby's life decreases the risk of sudden infant death syndrome (SIDS). Allow your baby to feed on each breast as long as he or she wants. When your baby unlatches or falls asleep while feeding from the first breast, offer the second breast. Because newborns are often sleepy in the first few weeks of life, you may need to awaken your baby to get him or her to feed. Breastfeeding times will vary from baby to baby. However, the following rules can serve as a guide to help you make sure that your baby is properly fed:  Newborns (babies 82 weeks of age or younger) may breastfeed every 1-3 hours.  Newborns should not go without breastfeeding for longer than 3 hours during the day or 5 hours during the night.  You should breastfeed your baby a minimum of 8 times in a 24-hour period.  Breast milk pumping Pumping and storing breast milk allows you  to make sure that your baby is exclusively fed your breast milk, even at times when you are unable to breastfeed. This is especially important if you go back to work while you are still breastfeeding, or if you are not able to be present during feedings. Your lactation consultant can help you find a method of pumping that works best for you and give you guidelines about how long it is safe to store breast milk. Caring for your breasts while you breastfeed Nipples can become dry, cracked, and sore while breastfeeding. The following recommendations can help keep your breasts moisturized and healthy:  Avoid using soap on your nipples.  Wear a supportive bra designed especially for nursing. Avoid wearing underwire-style bras or extremely tight bras (sports bras).  Air-dry your nipples for 3-4 minutes after each feeding.  Use only cotton bra pads to absorb leaked breast milk. Leaking of breast milk between feedings is normal.  Use lanolin on your nipples after breastfeeding. Lanolin helps to maintain your skin's normal moisture barrier. Pure lanolin is not harmful (not toxic) to your baby. You may also hand express a few drops of breast milk and gently massage that milk into your nipples and allow the milk to air-dry.  In the first few weeks after giving birth, some women experience breast engorgement. Engorgement can make your breasts feel heavy, warm, and tender to the touch. Engorgement peaks within 3-5 days after you give birth. The following recommendations can help to ease engorgement:  Completely empty your breasts while breastfeeding or pumping. You may want to start by applying warm, moist heat (in the shower or with warm, water-soaked hand towels) just before feeding or pumping. This  increases circulation and helps the milk flow. If your baby does not completely empty your breasts while breastfeeding, pump any extra milk after he or she is finished.  Apply ice packs to your breasts immediately  after breastfeeding or pumping, unless this is too uncomfortable for you. To do this: ? Put ice in a plastic bag. ? Place a towel between your skin and the bag. ? Leave the ice on for 20 minutes, 2-3 times a day.  Make sure that your baby is latched on and positioned properly while breastfeeding.  If engorgement persists after 48 hours of following these recommendations, contact your health care provider or a Advertising copywriter. Overall health care recommendations while breastfeeding  Eat 3 healthy meals and 3 snacks every day. Well-nourished mothers who are breastfeeding need an additional 450-500 calories a day. You can meet this requirement by increasing the amount of a balanced diet that you eat.  Drink enough water to keep your urine pale yellow or clear.  Rest often, relax, and continue to take your prenatal vitamins to prevent fatigue, stress, and low vitamin and mineral levels in your body (nutrient deficiencies).  Do not use any products that contain nicotine or tobacco, such as cigarettes and e-cigarettes. Your baby may be harmed by chemicals from cigarettes that pass into breast milk and exposure to secondhand smoke. If you need help quitting, ask your health care provider.  Avoid alcohol.  Do not use illegal drugs or marijuana.  Talk with your health care provider before taking any medicines. These include over-the-counter and prescription medicines as well as vitamins and herbal supplements. Some medicines that may be harmful to your baby can pass through breast milk.  It is possible to become pregnant while breastfeeding. If birth control is desired, ask your health care provider about options that will be safe while breastfeeding your baby. Where to find more information: [employer]: www.llli.org Contact a health care provider if:  You feel like you want to stop breastfeeding or have become frustrated with breastfeeding.  Your nipples are cracked or  bleeding.  Your breasts are red, tender, or warm.  You have: ? Painful breasts or nipples. ? A swollen area on either breast. ? A fever or chills. ? Nausea or vomiting. ? Drainage other than breast milk from your nipples.  Your breasts do not become full before feedings by the fifth day after you give birth.  You feel sad and depressed.  Your baby is: ? Too sleepy to eat well. ? Having trouble sleeping. ? More than 22 week old and wetting fewer than 6 diapers in a 24-hour period. ? Not gaining weight by 35 days of age.  Your baby has fewer than 3 stools in a 24-hour period.  Your baby's skin or the white parts of his or her eyes become yellow. Get help right away if:  Your baby is overly tired (lethargic) and does not want to wake up and feed.  Your baby develops an unexplained fever. Summary  Breastfeeding offers many health benefits for infant and mothers.  Try to breastfeed your infant when he or she shows early signs of hunger.  Gently tickle or stroke your baby's lips with your finger or nipple to allow the baby to open his or her mouth. Bring the baby to your breast. Make sure that much of the areola is in your baby's mouth. Offer one side and burp the baby before you offer the other side.  Talk with your  health care provider or lactation consultant if you have questions or you face problems as you breastfeed. This information is not intended to replace advice given to you by your health care provider. Make sure you discuss any questions you have with your health care provider. Document Released: 11/06/2005 Document Revised: 12/08/2016 Document Reviewed: 12/08/2016 Elsevier Interactive Patient Education  2018 [employer]  Vaginal Delivery, Care After Refer to this sheet in the next few weeks. These discharge instructions provide you with information on caring for yourself after delivery. Your caregiver may also give you specific instructions. Your treatment has been  planned according to the most current medical practices available, but problems sometimes occur. Call your caregiver if you have any problems or questions after you go home. HOME CARE INSTRUCTIONS 1. Take over-the-counter or prescription medicines only as directed by your caregiver or pharmacist. 2. Do not drink alcohol, especially if you are breastfeeding or taking medicine to relieve pain. 3. Do not smoke tobacco. 4. Continue to use good perineal care. Good perineal care includes: 1. Wiping your perineum from back to front 2. Keeping your perineum clean. 3. You can do sitz baths twice a day, to help keep this area clean 5. Do not use tampons, douche or have sex for 6 weeks 6. Shower only and avoid sitting in submerged water, aside from sitz baths 7. Wear a well-fitting bra that provides breast support. 8. Eat healthy foods. 9. Drink enough fluids to keep your urine clear or pale yellow. 10. Eat high-fiber foods such as whole grain cereals and breads, brown rice, beans, and fresh fruits and vegetables every day. These foods may help prevent or relieve constipation. 11. Avoid constipation with high fiber foods or medications, such as miralax or metamucil 12. Follow your caregiver's recommendations regarding resumption of activities such as climbing stairs, driving, lifting, exercising, or traveling. 13. Talk to your caregiver about resuming sexual activities. Resumption of sexual activities after 6 weeks is dependent upon your risk of infection, your rate of healing, and your comfort and desire to resume sexual activity. 14. Try to have someone help you with your household activities and your newborn for at least a few days after you leave the hospital. 15. Rest as much as possible. Try to rest or take a nap when your newborn is sleeping. 16. Increase your activities gradually. 17. Keep all of your scheduled postpartum appointments. It is very important to keep your scheduled follow-up  appointments. At these appointments, your caregiver will be checking to make sure that you are healing physically and emotionally. SEEK MEDICAL CARE IF:   You are passing large clots from your vagina. Save any clots to show your caregiver.  You have a foul smelling discharge from your vagina.  You have trouble urinating.  You are urinating frequently.  You have pain when you urinate.  You have a change in your bowel movements.  You have increasing redness, pain, or swelling near your vaginal incision (episiotomy) or vaginal tear.  You have pus draining from your episiotomy or vaginal tear.  Your episiotomy or vaginal tear is separating.  You have painful, hard, or reddened breasts.  You have a severe headache.  You have blurred vision or see spots.  You feel sad or depressed.  You have thoughts of hurting yourself or your newborn.  You have questions about your care, the care of your newborn, or medicines.  You are dizzy or light-headed.  You have a rash.  You have nausea or vomiting.  You were breastfeeding and have not had a menstrual period within 12 weeks after you stopped breastfeeding.  You are not breastfeeding and have not had a menstrual period by the 12th week after delivery.  You have a fever of 100.5 or more SEEK IMMEDIATE MEDICAL CARE IF:   You have persistent pain.  You have chest pain.  You have shortness of breath.  You faint.  You have leg pain.  You have stomach pain.  Your vaginal bleeding saturates two or more sanitary pads in 1 hour. MAKE SURE YOU:   Understand these instructions.  Will watch your condition.  Will get help right away if you are not doing well or get worse. Document Released: 11/03/2000 Document Revised: 03/23/2014 Document Reviewed: 07/03/2012 [facility] Patient Information 2015 [place], [place]. This information is not intended to replace advice given to you by your health care provider. Make sure you discuss any  questions you have with your health care provider.  Sitz Bath A sitz bath is a warm water bath taken in the sitting position. The water covers only the hips and butt (buttocks). We recommend using one that fits in the toilet, to help with ease of use and cleanliness. It may be used for either healing or cleaning purposes. Sitz baths are also used to relieve pain, itching, or muscle tightening (spasms). The water may contain medicine. Moist heat will help you heal and relax.  HOME CARE  Take 3 to 4 sitz baths a day. 18. Fill the bathtub half-full with warm water. 19. Sit in the water and open the drain a little. 20. Turn on the warm water to keep the tub half-full. Keep the water running constantly. 21. Soak in the water for 15 to 20 minutes. 22. After the sitz bath, pat the affected area dry. GET HELP RIGHT AWAY IF: You get worse instead of better. Stop the sitz baths if you get worse. MAKE SURE YOU:  Understand these instructions.  Will watch your condition.  Will get help right away if you are not doing well or get worse. Document Released: 12/14/2004 Document Revised: 07/31/2012 Document Reviewed: 03/06/2011 [facility] Patient Information 2015 [place], [place]. This information is not intended to replace advice given to you by your health care provider. Make sure you discuss any questions you have with your health care provider.    Breast Pumping Tips If you are breastfeeding, there may be times when you cannot feed your baby directly. Returning to work or going on a trip are common examples. Pumping allows you to store breast milk and feed it to your baby later. You may not get much milk when you first start to pump. Your breasts should start to make more after a few days. If you pump at the times you usually feed your baby, you may be able to keep making enough milk to feed your baby without also using formula. The more often you pump, the more milk you will produce. When should I  pump?  You can begin to pump soon after delivery. However, some experts recommend waiting about 4 weeks before giving your infant a bottle to make sure breastfeeding is going well.  If you plan to return to work, begin pumping a few weeks before. This will help you develop techniques that work best for you. It also lets you build up a supply of breast milk.  When you are with your infant, feed on demand and pump after each feeding.  When you are away from  your infant for several hours, pump for about 15 minutes every 2-3 hours. Pump both breasts at the same time if you can.  If your infant has a formula feeding, make sure to pump around the same time.  If you drink any alcohol, wait 2 hours before pumping. How do I prepare to pump? Your let-down reflexis the natural reaction to stimulation that makes your breast milk flow. It is easier to stimulate this reflex when you are relaxed. Find relaxation techniques that work for you. If you have difficulty with your let-down reflex, try these methods:  Smell one of your infant's blankets or an item of clothing.  Look at a picture or video of your infant.  Sit in a quiet, private space.  Massage the breast you plan to pump.  Place soothing warmth on the breast.  Play relaxing music.  What are some general breast pumping tips?  Wash your hands before you pump. You do not need to wash your nipples or breasts.  There are three ways to pump. ? You can use your hand to massage and compress your breast. ? You can use a handheld manual pump. ? You can use an electric pump.  Make sure the suction cup (flange) on the breast pump is the right size. Place the flange directly over the nipple. If it is the wrong size or placed the wrong way, it may be painful and cause nipple damage.  If pumping is uncomfortable, apply a small amount of purified or modified lanolin to your nipple and areola.  If you are using an electric pump, adjust the speed  and suction power to be more comfortable.  If pumping is painful or if you are not getting very much milk, you may need a different type of pump. A lactation consultant can help you determine what type of pump to use.  Keep a full water bottle near you at all times. Drinking lots of fluid helps you make more milk.  You can store your milk to use later. Pumped breast milk can be stored in a sealable, sterile container or plastic bag. Label all stored breast milk with the date you pumped it. ? Milk can stay out at room temperature for up to 8 hours. ? You can store your milk in the refrigerator for up to 8 days. ? You can store your milk in the freezer for 3 months. Thaw frozen milk using warm water. Do not put it in the microwave.  Do not smoke. Smoking can lower your milk supply and harm your infant. If you need help quitting, ask your health care provider to recommend a program. When should I call my health care provider or a lactation consultant?  You are having trouble pumping.  You are concerned that you are not making enough milk.  You have nipple pain, soreness, or redness.  You want to use birth control. Birth control pills may lower your milk supply. Talk to your health care provider about your options. This information is not intended to replace advice given to you by your health care provider. Make sure you discuss any questions you have with your health care provider. Document Released: 04/26/2010 Document Revised: 04/19/2016 Document Reviewed: 08/29/2013 Elsevier Interactive Patient Education  2017 [employer].

## 2018-10-28 NOTE — Lactation Note (Signed)
This note was copied from a baby's chart. Lactation Consultation Note  Patient Name: Evelyn Stevenson ZOXWR'UToday's Date: 10/28/2018     Maternal Data    Feeding Feeding Type: Bottle Fed - Formula Nipple Type: Slow - flow  LATCH Score                   Interventions    Lactation Tools Discussed/Used     Consult Status  LC f/u with parents about infant feeding concerns. Discussed physiology of breast milk supply, comfort of mom's breasts, stomach volume, and mom's concern about her nipple ring. LC spoke with midwife who notated that pt will request removal (surgical) upon consult for BLT with OB in approx. 4wks. LC explained to parents that if mom starts to notice changes and/or fever to have things evaluated sooner to make sure the area isn't becoming infected.     Evelyn Stevenson 10/28/2018, 11:38 AM

## 2018-10-29 ENCOUNTER — Inpatient Hospital Stay
Admission: RE | Admit: 2018-10-29 | Payer: Medicaid Other | Source: Home / Self Care | Admitting: Obstetrics and Gynecology

## 2018-10-29 ENCOUNTER — Encounter: Admission: RE | Payer: Self-pay | Source: Home / Self Care

## 2018-10-29 SURGERY — Surgical Case
Anesthesia: Choice | Laterality: Bilateral

## 2018-12-07 ENCOUNTER — Emergency Department: Payer: Medicaid Other

## 2018-12-07 ENCOUNTER — Other Ambulatory Visit: Payer: Self-pay

## 2018-12-07 ENCOUNTER — Encounter: Payer: Self-pay | Admitting: Emergency Medicine

## 2018-12-07 ENCOUNTER — Emergency Department
Admission: EM | Admit: 2018-12-07 | Discharge: 2018-12-07 | Disposition: A | Discharge status: Home / Self Care | Payer: Medicaid Other | Attending: Emergency Medicine | Admitting: Emergency Medicine

## 2018-12-07 DIAGNOSIS — N939 Abnormal uterine and vaginal bleeding, unspecified: Secondary | ICD-10-CM | POA: Insufficient documentation

## 2018-12-07 DIAGNOSIS — Z87891 Personal history of nicotine dependence: Secondary | ICD-10-CM | POA: Insufficient documentation

## 2018-12-07 LAB — CBC WITH DIFFERENTIAL/PLATELET
Abs Immature Granulocytes: 0.01 10*3/uL (ref 0.00–0.07)
Basophils Absolute: 0 10*3/uL (ref 0.0–0.1)
Basophils Relative: 1 %
EOS PCT: 1 %
Eosinophils Absolute: 0.1 10*3/uL (ref 0.0–0.5)
HCT: 39 % (ref 36.0–46.0)
Hemoglobin: 13.2 g/dL (ref 12.0–15.0)
Immature Granulocytes: 0 %
Lymphocytes Relative: 45 %
Lymphs Abs: 3.2 10*3/uL (ref 0.7–4.0)
MCH: 31.4 pg (ref 26.0–34.0)
MCHC: 33.8 g/dL (ref 30.0–36.0)
MCV: 92.9 fL (ref 80.0–100.0)
Monocytes Absolute: 0.5 10*3/uL (ref 0.1–1.0)
Monocytes Relative: 7 %
Neutro Abs: 3.3 10*3/uL (ref 1.7–7.7)
Neutrophils Relative %: 46 %
Platelets: 210 10*3/uL (ref 150–400)
RBC: 4.2 MIL/uL (ref 3.87–5.11)
RDW: 12.7 % (ref 11.5–15.5)
WBC: 7.2 10*3/uL (ref 4.0–10.5)
nRBC: 0 % (ref 0.0–0.2)

## 2018-12-07 LAB — POCT PREGNANCY, URINE: Preg Test, Ur: NEGATIVE

## 2018-12-07 MED ORDER — NORETHINDRONE-ETH ESTRADIOL 0.5-35 MG-MCG PO TABS: 1.0000 | ORAL_TABLET | Freq: Three times a day (TID) | ORAL | 0 refills | Status: DC

## 2018-12-07 NOTE — Discharge Instructions (Addendum)
Please seek medical attention for any high fevers, chest pain, shortness of breath, change in behavior, persistent vomiting, bloody stool or any other new or concerning symptoms.  

## 2018-12-07 NOTE — ED Notes (Signed)
[place] notified POC negative. On the way to transport pt to [place] at this time.

## 2018-12-07 NOTE — ED Provider Notes (Signed)
[facility] Emergency Department Provider Note   ____________________________________________   I have reviewed the triage vital signs and the nursing notes.   HISTORY  Chief Complaint Vaginal Bleeding   History limited by: Not Limited   HPI Bufford SpikesShemika L Aundria RudRogers is a 33 y.o. female who presents to the emergency department today because of concern for vaginal bleeding. It started 8 days ago. It has been constant. It has been heavy. She states she is going through a pad every 1-2 hours. The patient is now feeling some dizziness and lightheadedness. The patient did deliver a baby a little over one month ago. States she did have some bleeding afterward but it had stopped almost three weeks ago. She denies any bleeding disorder. She has had some associated lower abdominal cramping which reminds her of menstrual cramping.    Per medical record review patient has a history of hear murmur.   Past Medical History:  Diagnosis Date  . Heart murmur     Patient Active Problem List   Diagnosis Date Noted  . Postpartum care following vaginal delivery 10/28/2018  . Vaginal birth after cesarean, delivered, current hospitalization 10/27/2018  . History of cesarean delivery 10/26/2018  . Normal labor 10/26/2018  . Braxton Hicks contractions 10/12/2018  . First trimester screening   . Family history of autism     Past Surgical History:  Procedure Laterality Date  . CESAREAN SECTION  2007  . LEEP      Prior to Admission medications   Medication Sig Start Date End Date Taking? Authorizing Provider  ibuprofen (ADVIL,MOTRIN) 600 MG tablet Take 1 tablet (600 mg total) by mouth every 6 (six) hours as needed for mild pain, moderate pain or cramping. 10/28/18   Farrel ConnersGutierrez, Colleen, CNM  medroxyPROGESTERone (DEPO-PROVERA) 150 MG/ML injection Inject 150 mgm IM once. To bring with her to her 4 week postpartum check up. 10/28/18   Farrel ConnersGutierrez, Colleen, CNM  Prenatal Multivit-Min-Fe-FA  (PRENATAL VITAMINS) 0.8 MG tablet Take 1 tablet by mouth daily. 10/28/18   Farrel ConnersGutierrez, Colleen, CNM    Allergies Patient has no known allergies.  Family History  Problem Relation Age of Onset  . Hypertension Mother   . Hypertension Father   . Cancer Maternal Grandmother     Social History Social History   Tobacco Use  . Smoking status: Former Smoker    Packs/day: 0.50    Types: Cigarettes  . Smokeless tobacco: Never Used  Substance Use Topics  . Alcohol use: Not Currently    Comment: occassional  . Drug use: No    Comment: pt states "smoked marijuana at first of my pregnancy, but then quit when I found out I was pregnant"    Review of Systems Constitutional: No fever/chills Eyes: No visual changes. ENT: No sore throat. Cardiovascular: Denies chest pain. Respiratory: Denies shortness of breath. Gastrointestinal: No abdominal pain.  No nausea, no vomiting.  No diarrhea.   Genitourinary: Positive for vaginal bleeding.  Musculoskeletal: Negative for back pain. Skin: Negative for rash. Neurological: Negative for headaches, focal weakness or numbness.  ____________________________________________   PHYSICAL EXAM:  VITAL SIGNS: ED Triage Vitals  Enc Vitals Group     BP 12/07/18 1621 (!) 142/88     Pulse Rate 12/07/18 1621 77     Resp --      Temp 12/07/18 1621 97.7 F (36.5 C)     Temp Source 12/07/18 1621 Oral     SpO2 12/07/18 1621 100 %     Weight 12/07/18  1621 230 lb (104.3 kg)     Height 12/07/18 1621 5\' 9"  (1.753 m)     Head Circumference --      Peak Flow --      Pain Score 12/07/18 1626 5   Constitutional: Alert and oriented.  Eyes: Conjunctivae are normal.  ENT      Head: Normocephalic and atraumatic.      Nose: No congestion/rhinnorhea.      Mouth/Throat: Mucous membranes are moist.      Neck: No stridor. Hematological/Lymphatic/Immunilogical: No cervical lymphadenopathy. Cardiovascular: Normal rate, regular rhythm.  No murmurs, rubs, or gallops.   Respiratory: Normal respiratory effort without tachypnea nor retractions. Breath sounds are clear and equal bilaterally. No wheezes/rales/rhonchi. Gastrointestinal: Soft and non tender. No rebound. No guarding.  Genitourinary: Deferred Musculoskeletal: Normal range of motion in all extremities. No lower extremity edema. Neurologic:  Normal speech and language. No gross focal neurologic deficits are appreciated.  Skin:  Skin is warm, dry and intact. No rash noted. Psychiatric: Mood and affect are normal. Speech and behavior are normal. Patient exhibits appropriate insight and judgment.  ____________________________________________    LABS (pertinent positives/negatives)  Upreg negative CBC wbc 7.2, hgb 13.2, plt 210  ____________________________________________   EKG  None  ____________________________________________    RADIOLOGY  US pelvis No concerning findings  ____________________________________________   PROCEDURES  Procedures  ____________________________________________   INITIAL IMPRESSION / ASSESSMENT AND PLAN / ED COURSE  Pertinent labs & imaging results that were available during my care of the patient were reviewed by me and considered in my medical decision making (see chart for details).   Patient presented to the emergency department today because of concerns for heavy vaginal bleeding.  Patient's blood work without concerning anemia.  Patient was neither hypotensive nor tachycardic.  Ultrasound did not show any concerning findings.  Did discuss findings with patient.  She did want to try birth control to help with the abnormal bleeding.  Discussed importance of following up with OB/GYN.  Did discuss blood clotting risk with patient.   ____________________________________________   FINAL CLINICAL IMPRESSION(S) / ED DIAGNOSES  Final diagnoses:  Abnormal vaginal bleeding     Note: This dictation was prepared with Dragon dictation. Any  transcriptional errors that result from this process are unintentional     Phineas SemenGoodman, Jaryd Drew, MD 12/07/18 2028

## 2018-12-07 NOTE — ED Notes (Signed)
Labs sent

## 2018-12-07 NOTE — ED Notes (Signed)
POC Preg neg

## 2018-12-07 NOTE — ED Triage Notes (Signed)
Pt reports had a baby 10/26/18 reports she started to bleeding 11/28/2018 reports she has been saturating pads more often than usual. Pt reports feeling lightheaded at times, pt denies any other symptoms pt talks in complete sentences no distress noted. Pt talks in complete sentences no distress noted

## 2018-12-18 DIAGNOSIS — E669 Obesity, unspecified: Secondary | ICD-10-CM | POA: Insufficient documentation

## 2019-06-02 DIAGNOSIS — Z6838 Body mass index (BMI) 38.0-38.9, adult: Secondary | ICD-10-CM

## 2019-06-02 DIAGNOSIS — E669 Obesity, unspecified: Secondary | ICD-10-CM

## 2019-06-03 ENCOUNTER — Other Ambulatory Visit: Payer: Self-pay

## 2019-06-03 ENCOUNTER — Ambulatory Visit (LOCAL_COMMUNITY_HEALTH_CENTER): Payer: Medicaid Other

## 2019-06-03 VITALS — BP 114/81 | Ht 69.0 in | Wt 265.5 lb

## 2019-06-03 DIAGNOSIS — Z3009 Encounter for other general counseling and advice on contraception: Secondary | ICD-10-CM

## 2019-06-03 DIAGNOSIS — Z30013 Encounter for initial prescription of injectable contraceptive: Secondary | ICD-10-CM | POA: Diagnosis not present

## 2019-06-03 MED ORDER — MEDROXYPROGESTERONE ACETATE 150 MG/ML IM SUSP
150.0000 mg | Freq: Once | INTRAMUSCULAR | Status: AC
  Administered 2019-06-03: 150 mg via INTRAMUSCULAR

## 2019-06-03 NOTE — Progress Notes (Signed)
Client states is going to begin taking MVI tablets she has at home. Currently taking Ibuprofen (as per med list) for tooth pain. Depo administered without difficulty per 12/18/2018 written order of E. Sciora CNM and client tolerated without difficulty.

## 2019-08-18 ENCOUNTER — Other Ambulatory Visit: Payer: Self-pay

## 2019-08-18 ENCOUNTER — Ambulatory Visit (LOCAL_COMMUNITY_HEALTH_CENTER): Payer: Medicaid Other

## 2019-08-18 VITALS — Ht 69.0 in | Wt 266.0 lb

## 2019-08-18 DIAGNOSIS — Z3009 Encounter for other general counseling and advice on contraception: Secondary | ICD-10-CM

## 2019-08-18 DIAGNOSIS — Z30013 Encounter for initial prescription of injectable contraceptive: Secondary | ICD-10-CM | POA: Diagnosis not present

## 2019-08-18 DIAGNOSIS — Z3042 Encounter for surveillance of injectable contraceptive: Secondary | ICD-10-CM

## 2019-08-18 MED ORDER — MEDROXYPROGESTERONE ACETATE 150 MG/ML IM SUSP
150.0000 mg | Freq: Once | INTRAMUSCULAR | Status: AC
  Administered 2019-08-18: 150 mg via INTRAMUSCULAR

## 2019-08-18 NOTE — Progress Notes (Signed)
Last physical at [facility] was 12/18/2018. Last depo at [facility] 06/03/2019; 10.6 weeks post depo. DMPA 150 mg IM per Donnal Moat, CNM order dated 12/18/2018.

## 2019-11-06 IMAGING — US US PELVIS COMPLETE WITH TRANSVAGINAL
1 series · 14 of 25 positions shown · non-contrast
Comparison: None

CLINICAL DATA: Vaginal bleeding for 2 days



[Series 1: us pelvis complete with transvaginal · 14 of 90 slices shown]
[im 1/90]
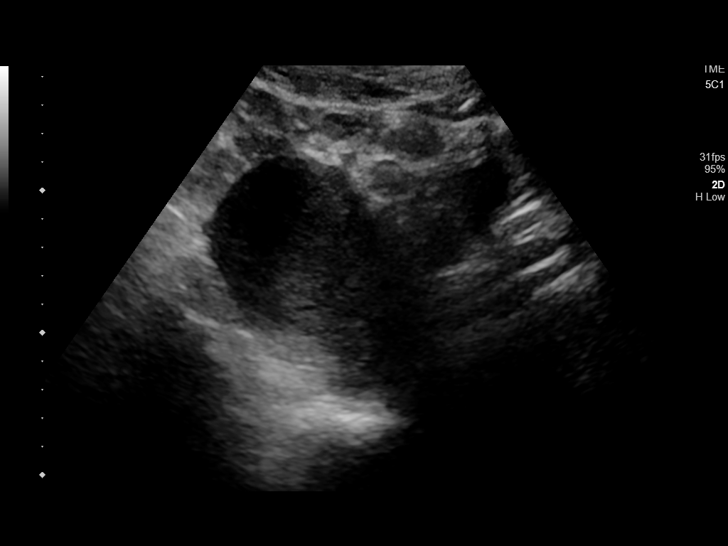
[im 8/90]
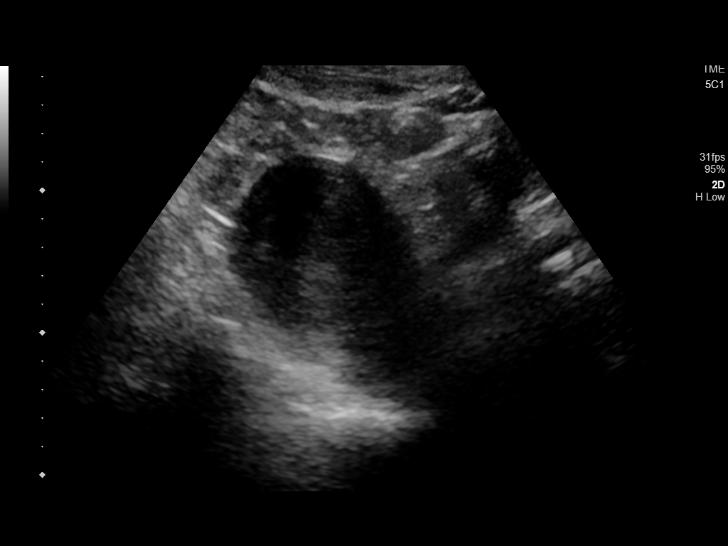
[im 15/90]
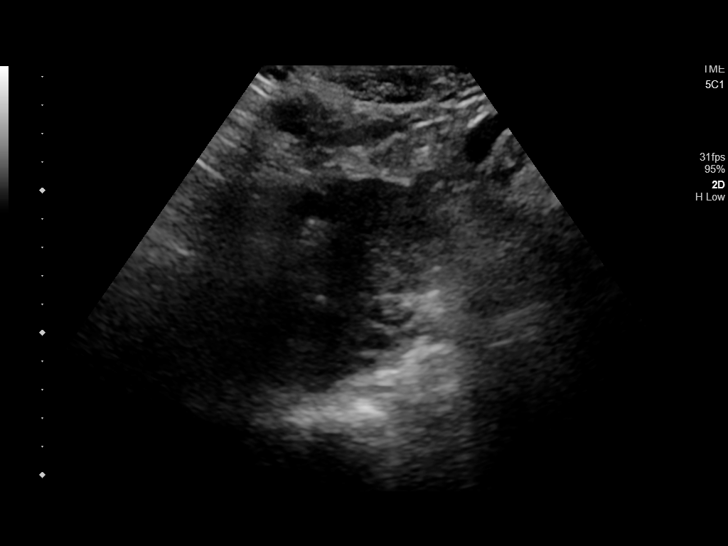
[im 23/90]
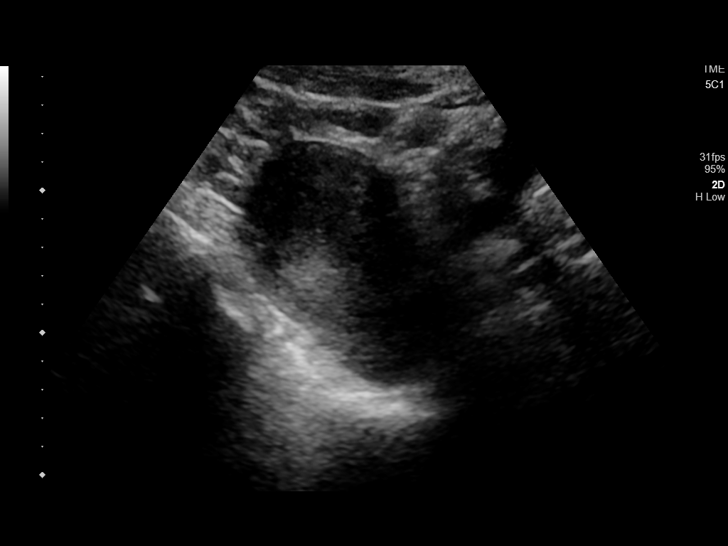
[im 30/90]
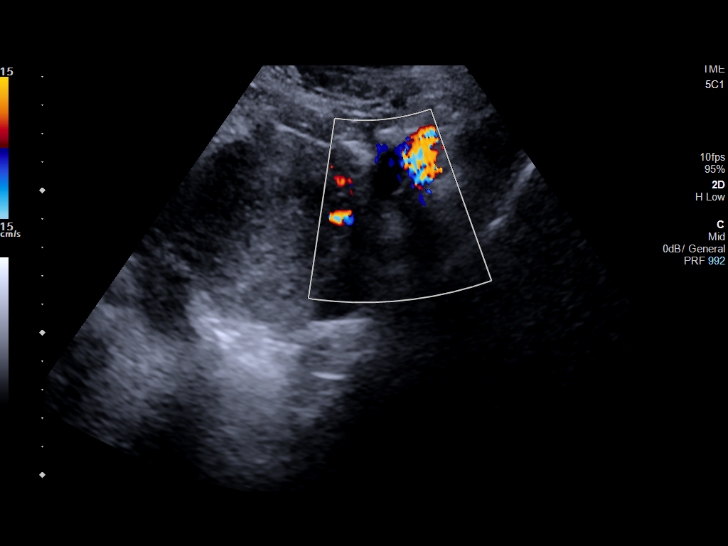
[im 34/90]
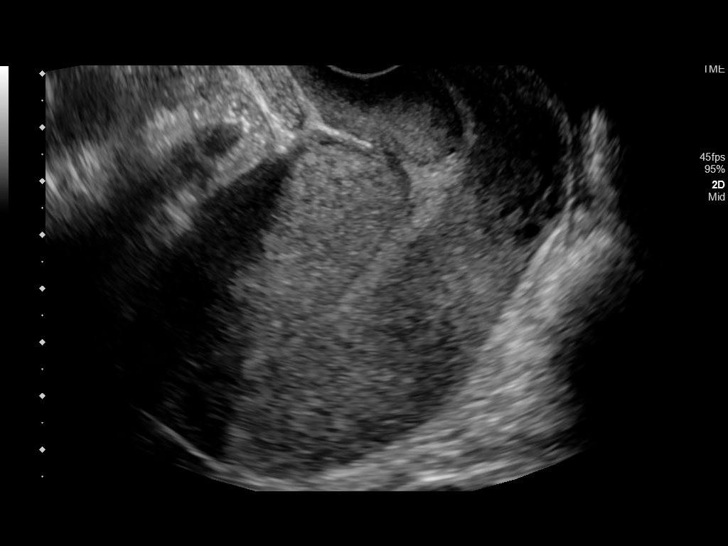
[im 41/90]
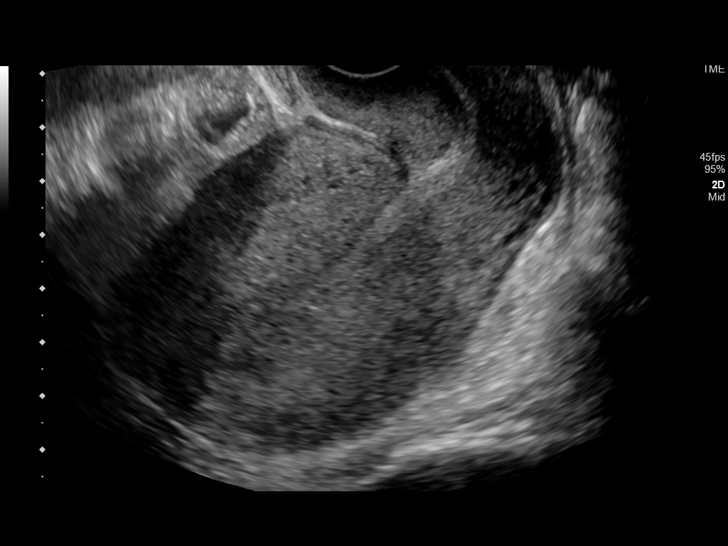
[im 49/90]
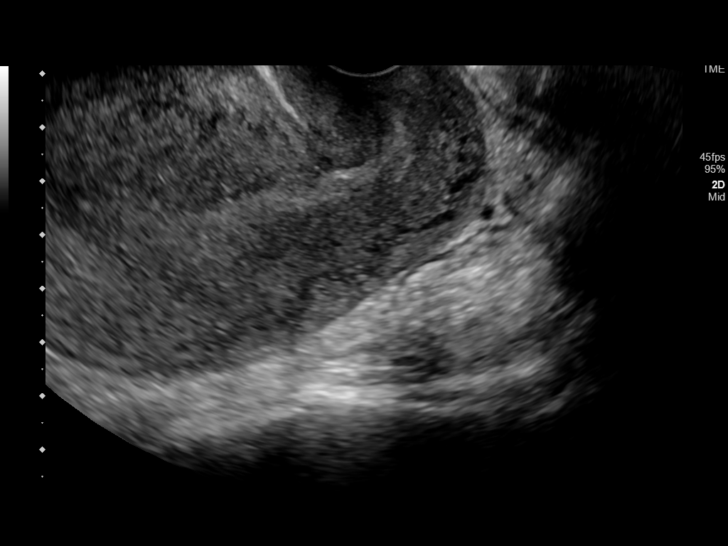
[im 56/90]
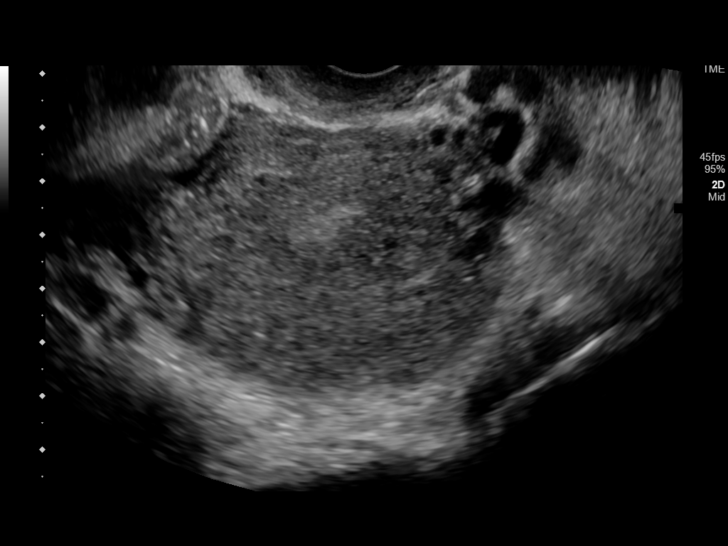
[im 60/90]
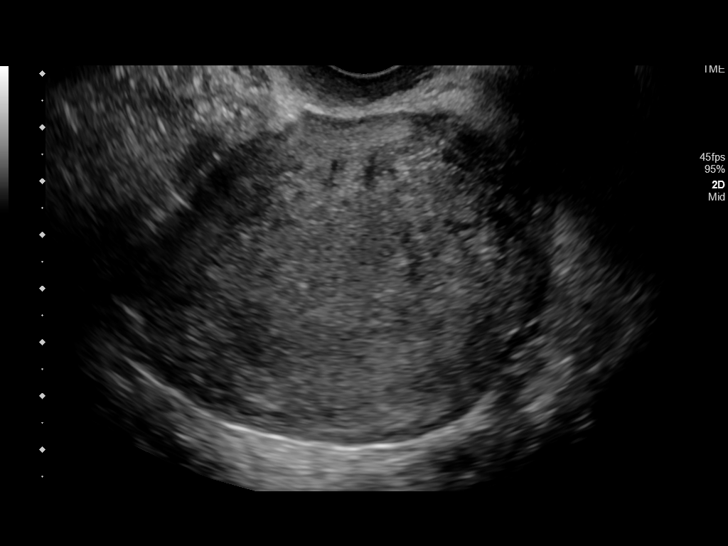
[im 67/90]
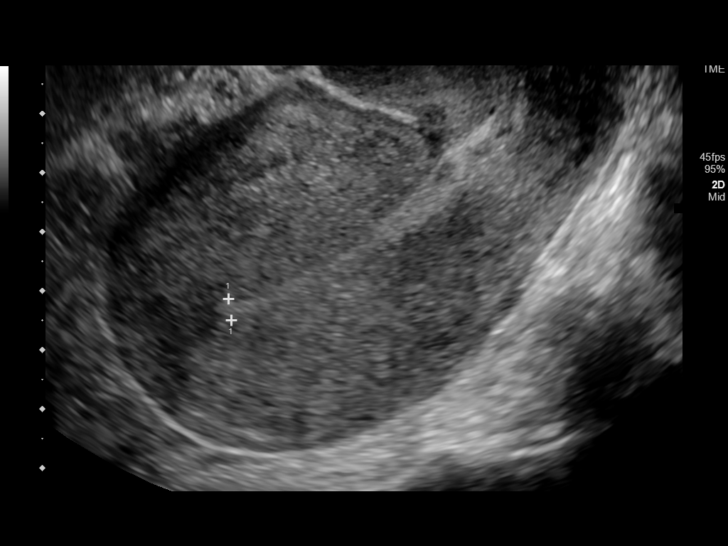
[im 75/90]
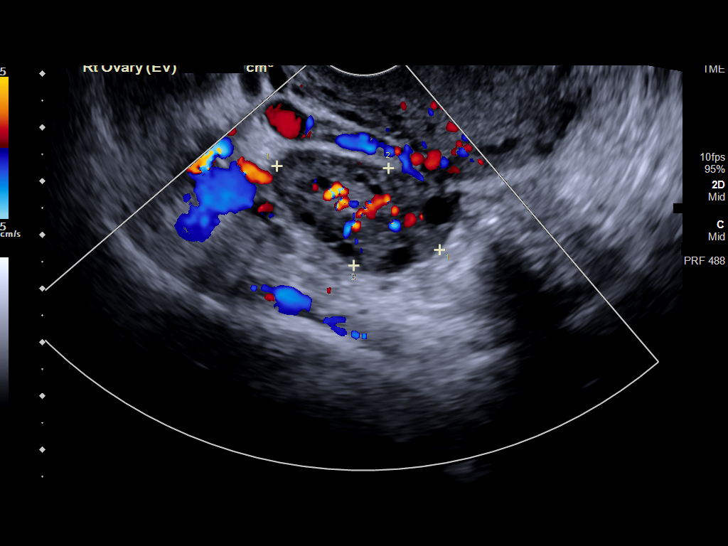
[im 82/90]
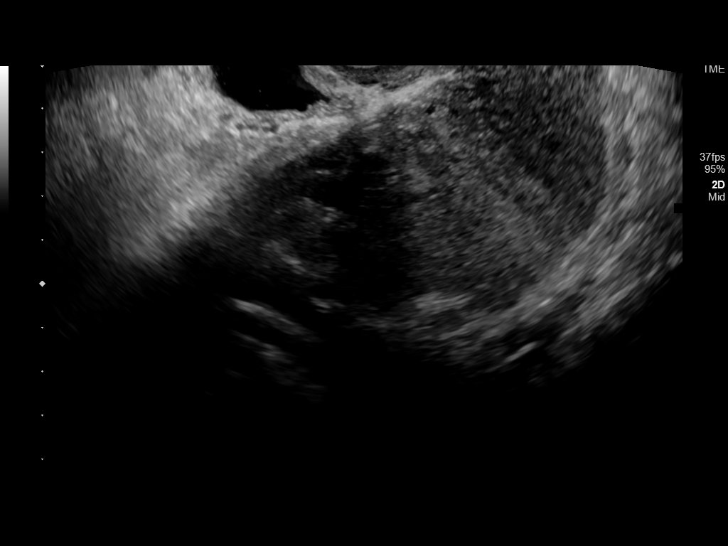
[im 90/90]
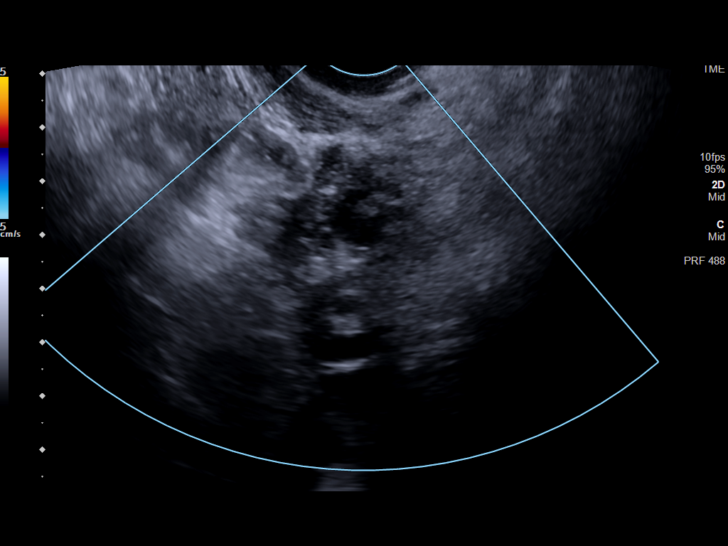

[14 of 25 positions shown; findings below may reference images not displayed]

FINDINGS: Uterus

Measurements: 9.3 x 5.9 x 6.3 cm. = volume: 178 mL. Caesarean
section scar is noted. No focal mass lesion is seen.

Endometrium

Thickness: 3.6 mm..  No focal abnormality visualized.

Right ovary

Measurements: 3.4 x 1.9 x 3.2 cm. = volume: 11 mL. Normal
appearance/no adnexal mass.

Left ovary

Measurements: 3.2 x 1.2 x 2.0 cm = volume: 4.2 mL. Normal
appearance/no adnexal mass.

Other findings

No abnormal free fluid.
IMPRESSION: Status post cesarean section.

No other focal abnormality is noted.

## 2019-11-07 ENCOUNTER — Ambulatory Visit: Payer: Medicaid Other

## 2019-11-18 ENCOUNTER — Other Ambulatory Visit: Payer: Self-pay

## 2019-11-18 ENCOUNTER — Ambulatory Visit (LOCAL_COMMUNITY_HEALTH_CENTER): Payer: Medicaid Other

## 2019-11-18 VITALS — BP 114/75 | Ht 69.0 in | Wt 269.0 lb

## 2019-11-18 DIAGNOSIS — Z30013 Encounter for initial prescription of injectable contraceptive: Secondary | ICD-10-CM

## 2019-11-18 DIAGNOSIS — Z3042 Encounter for surveillance of injectable contraceptive: Secondary | ICD-10-CM

## 2019-11-18 DIAGNOSIS — Z3009 Encounter for other general counseling and advice on contraception: Secondary | ICD-10-CM | POA: Diagnosis not present

## 2019-11-18 MED ORDER — MEDROXYPROGESTERONE ACETATE 150 MG/ML IM SUSP
150.0000 mg | Freq: Once | INTRAMUSCULAR | Status: AC
  Administered 2019-11-18: 150 mg via INTRAMUSCULAR

## 2019-11-18 NOTE — Progress Notes (Signed)
Last physical at [facility] was 12/18/2018. Last depo at [facility] was 08/18/2019; 13.1 weeks post depo today. DMPA 150 mg IM administered today per Donnal Moat, CNM order dated 12/18/2018.

## 2020-02-16 ENCOUNTER — Ambulatory Visit: Payer: Medicaid Other

## 2020-02-16 ENCOUNTER — Ambulatory Visit (LOCAL_COMMUNITY_HEALTH_CENTER): Payer: Medicaid Other

## 2020-02-16 ENCOUNTER — Other Ambulatory Visit: Payer: Self-pay

## 2020-02-16 VITALS — BP 105/70 | Ht 69.0 in | Wt 286.5 lb

## 2020-02-16 DIAGNOSIS — Z111 Encounter for screening for respiratory tuberculosis: Secondary | ICD-10-CM

## 2020-02-16 DIAGNOSIS — Z3009 Encounter for other general counseling and advice on contraception: Secondary | ICD-10-CM | POA: Diagnosis not present

## 2020-02-16 DIAGNOSIS — Z30013 Encounter for initial prescription of injectable contraceptive: Secondary | ICD-10-CM

## 2020-02-16 MED ORDER — MEDROXYPROGESTERONE ACETATE 150 MG/ML IM SUSP
150.0000 mg | Freq: Once | INTRAMUSCULAR | Status: AC
  Administered 2020-02-16: 150 mg via INTRAMUSCULAR

## 2020-02-16 NOTE — Progress Notes (Signed)
12.6 weeks post depo today, given 11/18/2019.  DMPA 150 mg IM per Dr. Lyndel Safe standing order for one time depo when physical is due (reference Arnetha Courser, CNM order for depo for 1 year dated 12/18/2018 in [facility]).

## 2020-02-19 ENCOUNTER — Ambulatory Visit (LOCAL_COMMUNITY_HEALTH_CENTER): Payer: Medicaid Other

## 2020-02-19 ENCOUNTER — Other Ambulatory Visit: Payer: Self-pay

## 2020-02-19 DIAGNOSIS — Z111 Encounter for screening for respiratory tuberculosis: Secondary | ICD-10-CM

## 2020-02-19 LAB — TB SKIN TEST
Induration: 0 mm
TB Skin Test: NEGATIVE

## 2020-04-22 IMAGING — US US MFM OB FOLLOW-UP
1 series · 13 of 28 positions shown · non-contrast
Comparison: none

PATIENT INFO:

PERFORMED BY:
INDY
SERVICE(S) PROVIDED:
INDICATIONS:
28 weeks gestation of pregnancy
FETAL EVALUATION:
Num Of Fetuses:         1
Fetal Heart Rate(bpm):  130
Cardiac Activity:       Present
Presentation:           Breech
Placenta:               Posterior, No previa
Amniotic Fluid
AFI FV:      Normal
AFI Sum(cm)     %Tile
13.6            42
BIOMETRY:
BPD:      65.9  mm     G. Age:  26w 4d          1  %    CI:        64.25   %    70 - 86
FL/HC:      20.1   %    19.6 -
HC:      264.5  mm     G. Age:  28w 6d         21  %    HC/AC:      1.11        0.99 -
AC:      237.8  mm     G. Age:  28w 1d         25  %    FL/BPD:     80.6   %    71 - 87
FL:       53.1  mm     G. Age:  28w 2d         22  %    FL/AC:      22.3   %    20 - 24
HUM:      49.9  mm     G. Age:  29w 2d         55  %
Est. FW:    5586  gm      2 lb 9 oz     22  %
GESTATIONAL AGE:
LMP:           28w 5d        Date:  01/26/18                 EDD:   11/02/18
U/S Today:     28w 0d                                        EDD:   11/07/18
Best:          28w 5d     Det. By:  LMP  (01/26/18)          EDD:   11/02/18
ANATOMY:
Cavum:                 Within Normal Limits   Ductal Arch:            Normal appearance
Ventricles:            Normal appearance      Diaphragm:              Within Normal Limits
Cerebellum:            Visualized             Stomach:                Seen
previously
Posterior Fossa:       Visualized             Abdominal Wall:         Visualized
previously                                     previously
Face:                  Orbits visualized      Cord Vessels:           3 vessels,
previously                                     visualized previously
Lips:                  Visualized             Kidneys:                Normal appearance
Heart:                 4-Chamber view         Bladder:                Seen
appears normal
RVOT:                  Normal appearance      Spine:                  Visualized
LVOT:                  Normal appearance      Upper Extremities:      Visualized
Aortic Arch:           Normal appearance      Lower Extremities:      Visualized

[Series 1: us mfm ob follow-up · 0.26mm/px · 13 of 52 slices shown]
[im 2/52]
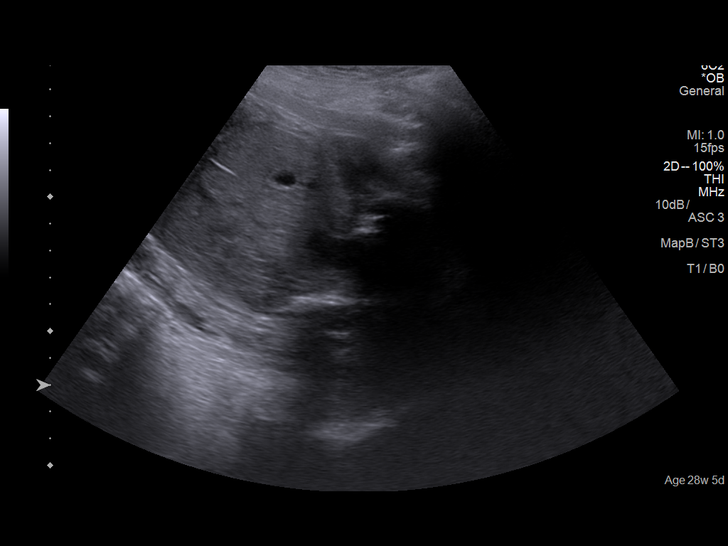
[im 6/52]
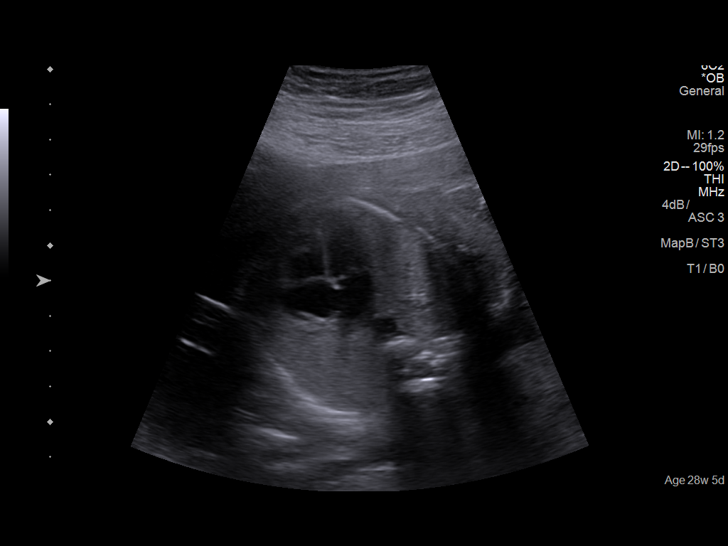
[im 10/52]
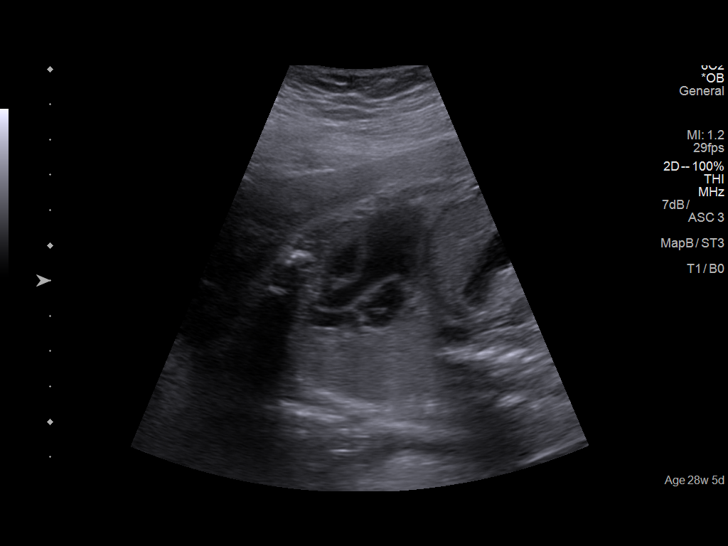
[im 14/52]
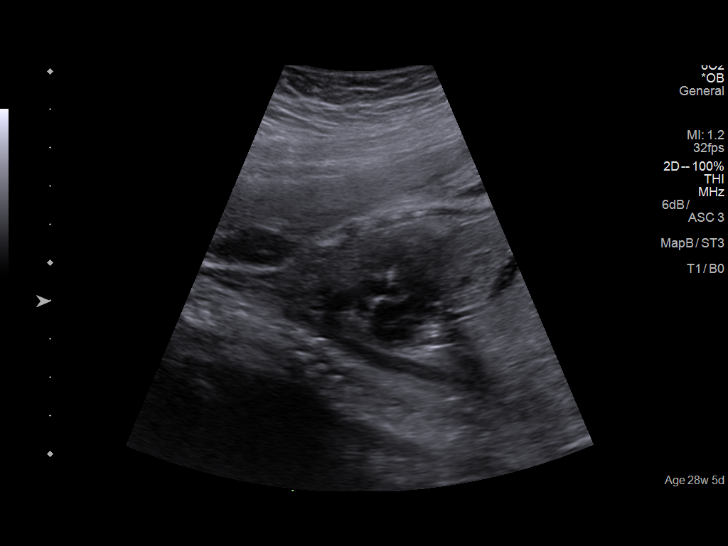
[im 18/52]
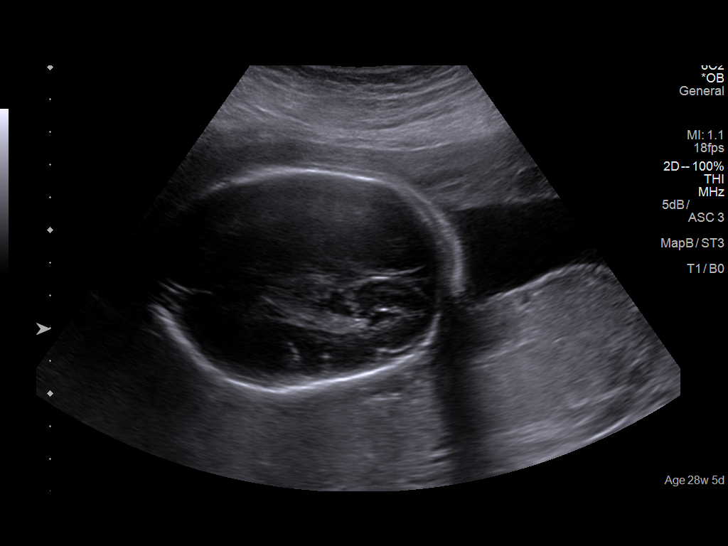
[im 21/52]
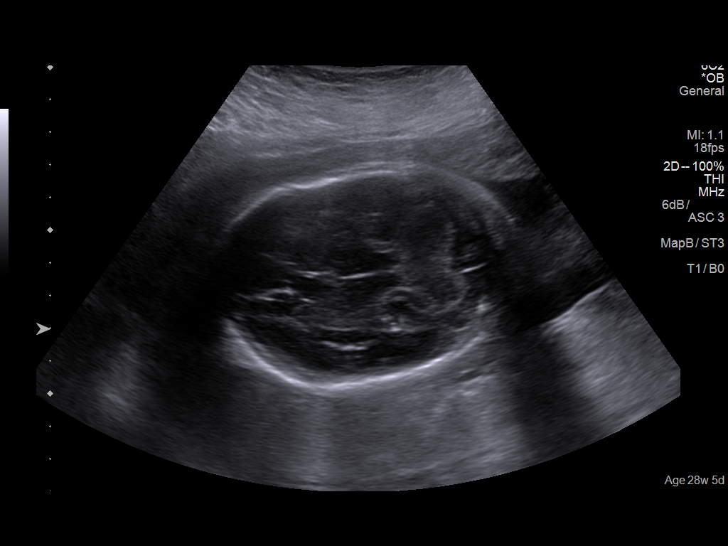
[im 27/52]
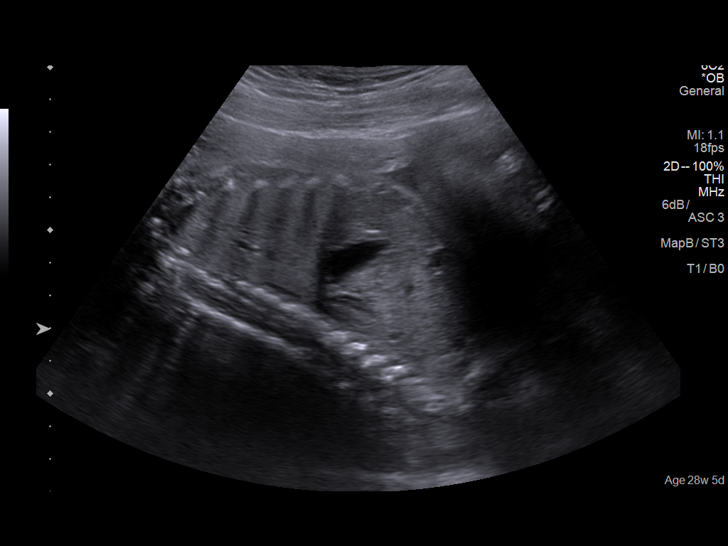
[im 31/52]
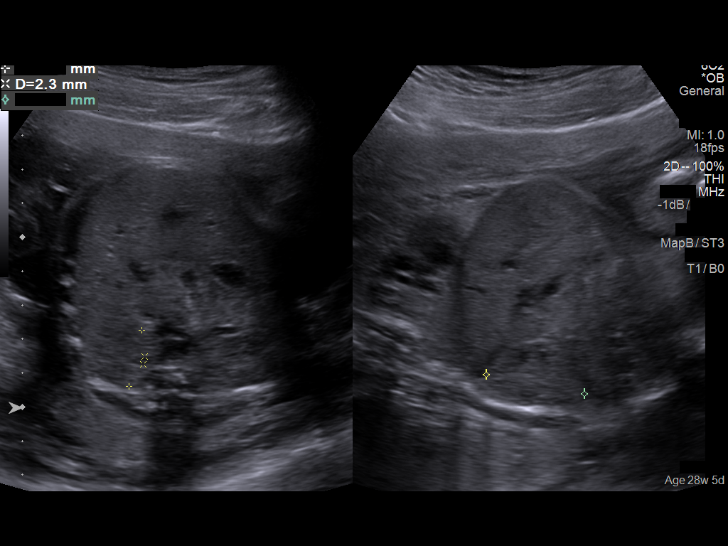
[im 35/52]
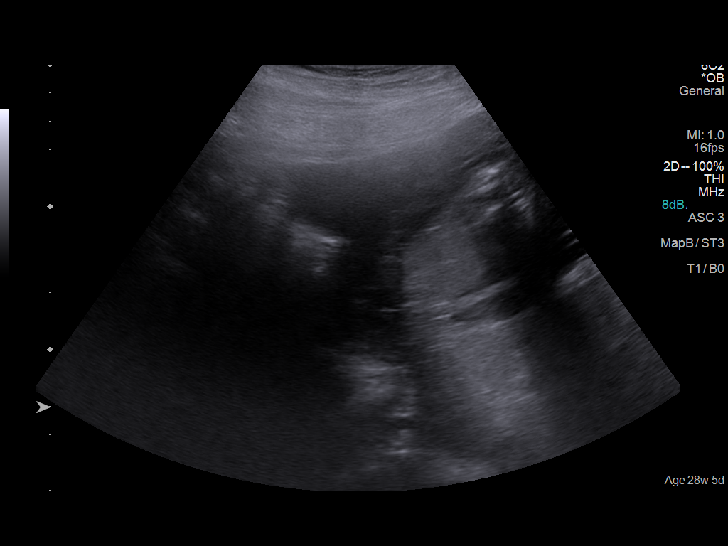
[im 38/52]
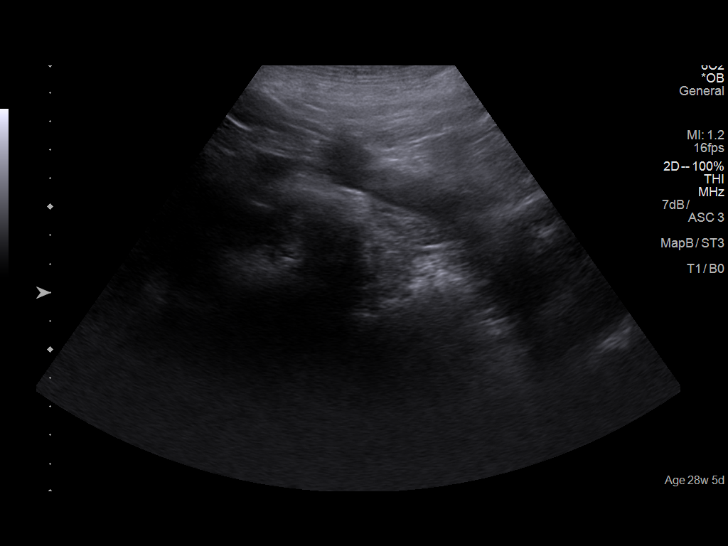
[im 42/52]
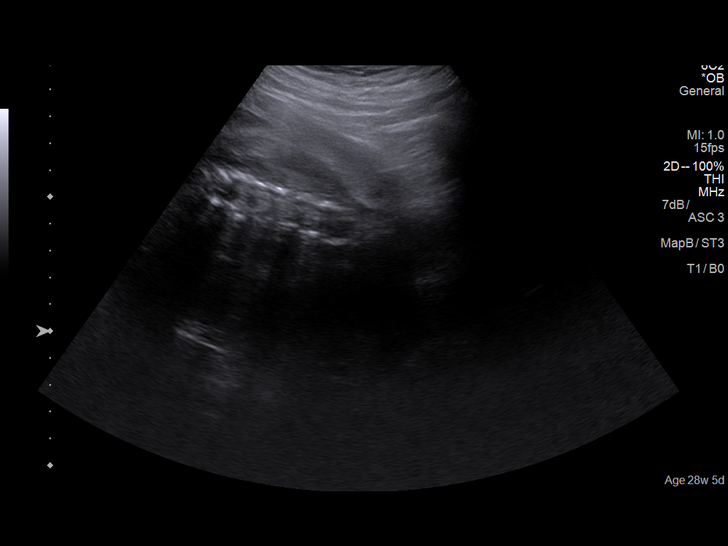
[im 46/52]
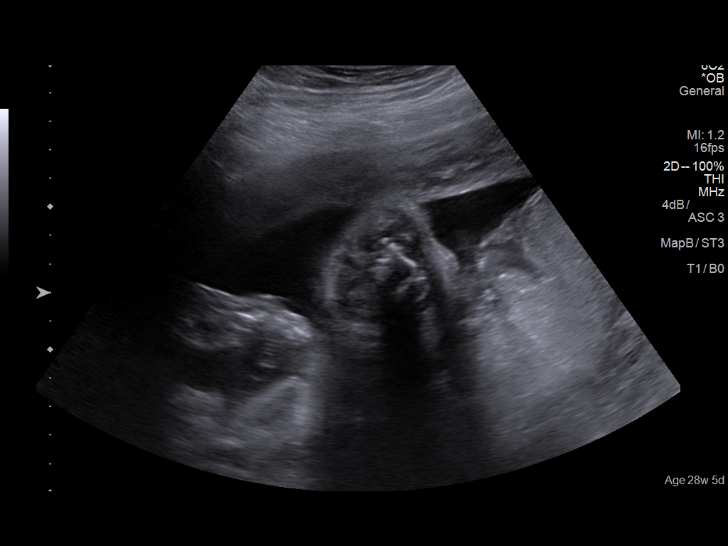
[im 50/52]
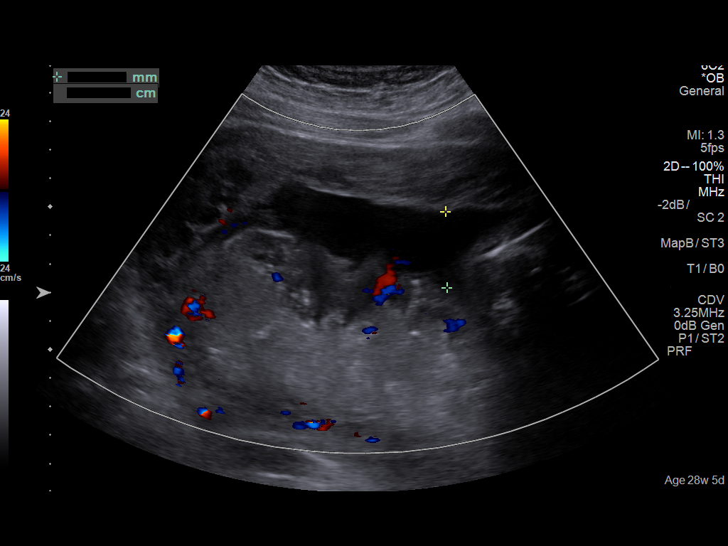

[13 of 28 positions shown; findings below may reference images not displayed]

IMPRESSION: Dear Dr. Ct,

Thank you for referring your patient to [facility] for a
fetal growth evaluation.

There is a singleton gestation with normal amniotic fluid
volume.

The fetal biometry correlates with established dating.
Adequate interval growth noted.

The estimated fetal weight is at the 22nd percentile.

Recommend follow-up scan for fetal growth monthly.

Thank you for allowing us to participate in your patient's care.
assistance.

[employer]

## 2020-05-03 ENCOUNTER — Encounter: Payer: Self-pay | Admitting: Advanced Practice Midwife

## 2020-05-03 ENCOUNTER — Ambulatory Visit (LOCAL_COMMUNITY_HEALTH_CENTER): Payer: Medicaid Other | Admitting: Advanced Practice Midwife

## 2020-05-03 ENCOUNTER — Ambulatory Visit: Payer: Medicaid Other

## 2020-05-03 ENCOUNTER — Other Ambulatory Visit: Payer: Self-pay

## 2020-05-03 DIAGNOSIS — Z3042 Encounter for surveillance of injectable contraceptive: Secondary | ICD-10-CM

## 2020-05-03 DIAGNOSIS — Z3009 Encounter for other general counseling and advice on contraception: Secondary | ICD-10-CM | POA: Diagnosis not present

## 2020-05-03 DIAGNOSIS — Z30013 Encounter for initial prescription of injectable contraceptive: Secondary | ICD-10-CM | POA: Diagnosis not present

## 2020-05-03 MED ORDER — MEDROXYPROGESTERONE ACETATE 150 MG/ML IM SUSP
150.0000 mg | Freq: Once | INTRAMUSCULAR | Status: AC
  Administered 2020-05-03: 150 mg via INTRAMUSCULAR

## 2020-05-03 MED ORDER — MULTIVITAMINS PO CAPS: 1.0000 | ORAL_CAPSULE | Freq: Every day | ORAL | 0 refills | Status: AC

## 2020-05-03 NOTE — Progress Notes (Signed)
RN consulted with provider--ok to give DMPA

## 2020-05-03 NOTE — Progress Notes (Signed)
Here today for Depo. Requests to reschedule PE. Last PE here was 12/18/2018 and last Pap here was 03/20/2016. Last Depo was 02/16/2020 (11.0 weeks.) Consulted with E. Sciora, CNM. One time Depo order given. Explained to patient that she will have to have a PE with next Depo. Depo given and tolerated well. Tawny Hopping, RN

## 2020-07-19 ENCOUNTER — Ambulatory Visit: Payer: Self-pay

## 2020-07-19 ENCOUNTER — Encounter: Payer: Self-pay | Admitting: Family Medicine

## 2020-07-19 ENCOUNTER — Other Ambulatory Visit: Payer: Self-pay

## 2020-07-19 ENCOUNTER — Telehealth: Payer: Self-pay

## 2020-07-19 ENCOUNTER — Ambulatory Visit (LOCAL_COMMUNITY_HEALTH_CENTER): Payer: Medicaid Other | Admitting: Family Medicine

## 2020-07-19 ENCOUNTER — Ambulatory Visit: Payer: Medicaid Other

## 2020-07-19 VITALS — BP 115/78 | Ht 69.0 in | Wt 279.6 lb

## 2020-07-19 DIAGNOSIS — J45909 Unspecified asthma, uncomplicated: Secondary | ICD-10-CM | POA: Insufficient documentation

## 2020-07-19 DIAGNOSIS — Z01419 Encounter for gynecological examination (general) (routine) without abnormal findings: Secondary | ICD-10-CM | POA: Diagnosis not present

## 2020-07-19 DIAGNOSIS — Z3009 Encounter for other general counseling and advice on contraception: Secondary | ICD-10-CM | POA: Diagnosis not present

## 2020-07-19 DIAGNOSIS — Z30013 Encounter for initial prescription of injectable contraceptive: Secondary | ICD-10-CM

## 2020-07-19 MED ORDER — MEDROXYPROGESTERONE ACETATE 150 MG/ML IM SUSP
150.0000 mg | INTRAMUSCULAR | Status: AC
  Administered 2020-07-19 – 2021-03-15 (×4): 150 mg via INTRAMUSCULAR

## 2020-07-19 NOTE — Progress Notes (Signed)
[facility] [address] [place], [place] [address] Main Number: [phone]  Family Planning Visit- Initial Visit  Subjective:  Evelyn Stevenson is a 34 y.o.  G2P2002  being seen today for an initial well woman visit and to discuss family planning options. Patient reports they do not want a pregnancy in the next year.   Chief Complaint  Patient presents with  . Contraception    Pt has Family history of autism; History of cesarean delivery; Obesity, unspecified; and Asthma on their problem list.   HPI  Patient reports she is here for physical and depo. She is s/p last injection 11 wks ago. She has no concerns today.  Pt denies all of the following, which are contraindications to Depo use: Known breast cancer Pregnancy Also denies: Hypertension (CDC cat 2 if mild, cat 3 if severe) Severe cirrhosis, hepatocellular adenoma Diabetes with nephrosis or vascular complications Ischemic heart disease or multiple risk factors for atherosclerotic disease, and some forms of lupus Unexplained vaginal bleeding Pregnancy planned within the next year Long-term use of corticosteroid therapy in women with a history of, or risk factors for, nontraumatic (frailty) fractures.  Current use of aminoglutethimide (usually for the treatment of Cushing's syndrome) because aminoglutethimide may increase metabolism of progestins    Patient's last menstrual period was 07/19/2020 (exact date). BCM: depo - continuous use Pt desires EC? n/a  Last pap per pt/review of record: 03/16/2016 = "negative" Last HIV test per pt/review of record: 10/2018 Last tetanus vaccine: 2019  Last breast exam: unsure Personal/family hx breast cancer? no  Patient reports 1 partner(s) in last year. Do they desire STI screening (if no, why not)? No -d eclines  Does the patient desire a pregnancy in the next year? no   34 y.o., Body mass index is 41.29 kg/m. - Is  patient eligible for HA1C diabetes screening based on BMI and age >30?  no  HCV screening;       Has patient been screened once for HCV in the past?  No - declines today  No results found for: HCVAB      Does the patient have current drug use, have a partner with drug use, and/or has been incarcerated since last result? yes If yes-- Screen for HCV through [facility]   Does the patient meet criteria for HBV testing? yes Criteria:  -Household, sexual or needle sharing contact with HBV -History of drug use -HIV positive -Those with known Hep C  PHQ-2 wscore 0  See flowsheet for other program required questions.   Health Maintenance Due  Topic Date Due  . Hepatitis C Screening  Never done  . COVID-19 Vaccine (1) Never done  . INFLUENZA VACCINE  06/20/2020    ROS 10 point review of systems is otherwise negative except as mentioned in HPI and listed below: none  The following portions of the patient's history were reviewed and updated as appropriate: allergies, current medications, past family history, past medical history, past social history, past surgical history and problem list. Problem list updated.   See flowsheet for other program required questions.  Objective:   Vitals:   07/19/20 1454  BP: 115/78  Weight: 279 lb 9.6 oz (126.8 kg)  Height: 5\' 9"  (1.753 m)    Physical Exam Vitals and nursing note reviewed.  Constitutional:      Appearance: Normal appearance.  HENT:     Head: Normocephalic and atraumatic.     Mouth/Throat:  Mouth: Mucous membranes are moist.     Pharynx: Oropharynx is clear. No oropharyngeal exudate or posterior oropharyngeal erythema.  Eyes:     Conjunctiva/sclera: Conjunctivae normal.  Neck:     Thyroid: No thyroid mass, thyromegaly or thyroid tenderness.  Cardiovascular:     Rate and Rhythm: Normal rate and regular rhythm.     Pulses: Normal pulses.     Heart sounds: Normal heart sounds.  Pulmonary:     Effort: Pulmonary effort  is normal.     Breath sounds: Normal breath sounds.  Chest:     Breasts:        Right: No swelling, mass, nipple discharge, skin change or tenderness. +piercing partially visible: metal rod with ball protrudes from one R side of nipple; L side of nipple healed over, no metal rod or ball visible. No erythema.       Left: Normal. No swelling, mass, nipple discharge, skin change or tenderness. No piercings. Abdominal:     General: Abdomen is flat.     Palpations: There is no mass.     Tenderness: There is no abdominal tenderness. There is no rebound.  Lymphadenopathy:     Head:     Right side of head: No preauricular or posterior auricular adenopathy.     Left side of head: No preauricular or posterior auricular adenopathy.     Cervical: No cervical adenopathy.     Upper Body:     Right upper body: No supraclavicular or axillary adenopathy.     Left upper body: No supraclavicular or axillary adenopathy.     Lower Body: No right inguinal adenopathy. No left inguinal adenopathy.  Skin:    General: Skin is warm and dry.     Findings: No rash.  Neurological:     Mental Status: She is alert and oriented to person, place, and time.      Assessment and Plan:  Evelyn Stevenson is a 34 y.o. female presenting to the [facility] Department for an initial well woman exam/family planning visit.  Contraception counseling: Reviewed all forms of birth control options in the tiered based approach. available including abstinence; over the counter/barrier methods; hormonal contraceptive medication including pill, patch, ring, injection,contraceptive implant, ECP; hormonal and nonhormonal IUDs; permanent sterilization options including vasectomy and the various tubal sterilization modalities. Risks, benefits, and typical effectiveness rates were reviewed.  Questions were answered.  Written information was also given to the patient to review.  Patient desires depo, this was prescribed for patient.  She will follow up in  3 months for surveillance.  She was told to call with any further questions, or with any concerns about this method of contraception.  Emphasized use of condoms 100% of the time for STI prevention.  Emergency Contraception: n/a - continuous use of depo  1. Well woman exam -BCM: depo rx today x1 yr -Pap: last result 03/16/2016 = abstract in Epic says "negative" though pt would have been 29 - unclear if HPV done or not. Offered to do pap today though pt is in a hurry and prefers to wait until next visit.  -CBE: done today. "Active FYIs" info up to date. Recommended screening mammograms beginning at age 66 -STI screening: pt declines -PHQ-2 score = 0 -Hepatitis B/C screening: pt declines -Advised [facility] ob/gyn f/u for removal of nipple ring stuck in nipple x years, phone # given for pt to call. - medroxyPROGESTERone (DEPO-PROVERA) injection 150 mg   Return in about 3 months (around 10/19/2020)  for Depo.  No future appointments.  Ann Held, PA-C

## 2020-07-19 NOTE — Progress Notes (Signed)
In for PE and Depo. Declines STD screen today.Depo given, card reminder of when to return for next dose given. Sharlyne Pacas, RN

## 2020-07-19 NOTE — Telephone Encounter (Signed)
Depo received 01/2020 per standing order and by verbal order 04/2020 with counseling that PE due with next Depo. Client scheduled Depo appt in Nurse Clinic for this pm. Call to client and physical appt scheduled for this pm. Jossie Ng, RN

## 2020-09-01 ENCOUNTER — Telehealth: Payer: Self-pay | Admitting: Family Medicine

## 2020-09-01 NOTE — Telephone Encounter (Signed)
Pt states that she is normally doesn't bleed but she is bleeding heavily. Wants nurse to call her.

## 2020-09-01 NOTE — Telephone Encounter (Signed)
Returned patient phone call to provided contact number. Patient reports she has been experiencing heavy vaginal bleeding for 3-4 days. States she is saturating more than 2 pads in an hour. Consulted with provider C. Kizzie Ide, FNP which spoke with patient recommending that patient go to local ED for evaluation of above. Patient agreeable to recommendations. Tawny Hopping, RN

## 2020-10-04 ENCOUNTER — Ambulatory Visit (LOCAL_COMMUNITY_HEALTH_CENTER): Payer: Medicaid Other

## 2020-10-04 ENCOUNTER — Other Ambulatory Visit: Payer: Self-pay

## 2020-10-04 VITALS — BP 113/72 | Ht 69.0 in | Wt 295.0 lb

## 2020-10-04 DIAGNOSIS — Z3009 Encounter for other general counseling and advice on contraception: Secondary | ICD-10-CM | POA: Diagnosis not present

## 2020-10-04 DIAGNOSIS — Z3042 Encounter for surveillance of injectable contraceptive: Secondary | ICD-10-CM

## 2020-10-04 NOTE — Progress Notes (Signed)
Pt is 11.0 weeks post depo today. DMPA 150 mg IM administered today per Samara Snide, PA order dated 07/19/20.

## 2020-12-23 ENCOUNTER — Other Ambulatory Visit: Payer: Self-pay

## 2020-12-23 ENCOUNTER — Ambulatory Visit (LOCAL_COMMUNITY_HEALTH_CENTER): Payer: Medicaid Other

## 2020-12-23 VITALS — BP 109/79 | Ht 69.0 in | Wt 293.5 lb

## 2020-12-23 DIAGNOSIS — Z3042 Encounter for surveillance of injectable contraceptive: Secondary | ICD-10-CM | POA: Diagnosis not present

## 2020-12-23 DIAGNOSIS — Z3009 Encounter for other general counseling and advice on contraception: Secondary | ICD-10-CM | POA: Diagnosis not present

## 2020-12-23 NOTE — Progress Notes (Signed)
11 weeks 3 days post depo. Voices no concerns. Depo given today R Deltoid without difficulty. Depo order by Maximiano Coss, PA-C dated 07/19/2020. Next depo due 03/10/2021, pt aware. Jerel Shepherd, RN

## 2021-03-15 ENCOUNTER — Other Ambulatory Visit: Payer: Self-pay

## 2021-03-15 ENCOUNTER — Ambulatory Visit (LOCAL_COMMUNITY_HEALTH_CENTER): Payer: Medicaid Other

## 2021-03-15 VITALS — BP 126/81 | Ht 69.0 in | Wt 289.0 lb

## 2021-03-15 DIAGNOSIS — Z3042 Encounter for surveillance of injectable contraceptive: Secondary | ICD-10-CM | POA: Diagnosis not present

## 2021-03-15 DIAGNOSIS — Z309 Encounter for contraceptive management, unspecified: Secondary | ICD-10-CM

## 2021-03-15 DIAGNOSIS — Z3009 Encounter for other general counseling and advice on contraception: Secondary | ICD-10-CM

## 2021-03-15 NOTE — Progress Notes (Signed)
11 weeks 5 days post depo. Voices no concerns. Depo given per order by Maximiano Coss, PA dated 07/19/2020. Tolerated well L delt. Next depo due 05/31/2021, pt aware. Jerel Shepherd, RN

## 2021-07-01 ENCOUNTER — Ambulatory Visit (LOCAL_COMMUNITY_HEALTH_CENTER): Payer: Medicaid Other | Admitting: Nurse Practitioner

## 2021-07-01 ENCOUNTER — Other Ambulatory Visit: Payer: Self-pay

## 2021-07-01 VITALS — BP 111/73 | HR 76 | Wt 289.5 lb

## 2021-07-01 DIAGNOSIS — Z3009 Encounter for other general counseling and advice on contraception: Secondary | ICD-10-CM

## 2021-07-01 DIAGNOSIS — Z3042 Encounter for surveillance of injectable contraceptive: Secondary | ICD-10-CM

## 2021-07-01 MED ORDER — MEDROXYPROGESTERONE ACETATE 150 MG/ML IM SUSP: 150.0000 mg | INTRAMUSCULAR | 0 refills | Status: DC

## 2021-07-01 MED ORDER — MEDROXYPROGESTERONE ACETATE 150 MG/ML IM SUSP
150.0000 mg | Freq: Once | INTRAMUSCULAR | Status: AC
  Administered 2021-07-01: 150 mg via INTRAMUSCULAR

## 2021-07-01 NOTE — Progress Notes (Signed)
35 year old female in clinic today for a depo.  Last depo given 03/15/21 (15.3 wks).  No problems noted.  Patient given depo per 07/19/20 order Samara Snide, PA.  Next depo 09/16/21. Glenna Fellows, RN

## 2021-07-14 NOTE — Addendum Note (Signed)
Addended by: Glenna Fellows on: 07/14/2021 05:20 PM   Modules accepted: Level of Service

## 2021-09-19 ENCOUNTER — Ambulatory Visit: Payer: Medicaid Other

## 2021-09-21 ENCOUNTER — Encounter: Payer: Self-pay | Admitting: Physician Assistant

## 2021-09-21 ENCOUNTER — Ambulatory Visit (LOCAL_COMMUNITY_HEALTH_CENTER): Payer: Medicaid Other | Admitting: Physician Assistant

## 2021-09-21 ENCOUNTER — Other Ambulatory Visit: Payer: Self-pay

## 2021-09-21 VITALS — BP 124/78 | Ht 69.0 in | Wt 294.6 lb

## 2021-09-21 DIAGNOSIS — Z5321 Procedure and treatment not carried out due to patient leaving prior to being seen by health care provider: Secondary | ICD-10-CM

## 2021-09-21 NOTE — Progress Notes (Signed)
Patient into clinic for RP visit.  Patient was seen by RN and as I was entering the exam room the patient came out and stated that she needs to leave.  States that she just got a call that a family member was just in an accident.  Counseled patient to call later this week or next to reschedule her visit.  Per chart review, CBE will be due in 2023 and pap is due this year.

## 2021-09-27 ENCOUNTER — Ambulatory Visit (LOCAL_COMMUNITY_HEALTH_CENTER): Payer: Medicaid Other | Admitting: Advanced Practice Midwife

## 2021-09-27 ENCOUNTER — Encounter: Payer: Self-pay | Admitting: Advanced Practice Midwife

## 2021-09-27 ENCOUNTER — Other Ambulatory Visit: Payer: Self-pay

## 2021-09-27 VITALS — BP 120/79 | Ht 69.0 in | Wt 292.2 lb

## 2021-09-27 DIAGNOSIS — F17200 Nicotine dependence, unspecified, uncomplicated: Secondary | ICD-10-CM | POA: Diagnosis not present

## 2021-09-27 DIAGNOSIS — Z3042 Encounter for surveillance of injectable contraceptive: Secondary | ICD-10-CM | POA: Diagnosis not present

## 2021-09-27 DIAGNOSIS — Z3009 Encounter for other general counseling and advice on contraception: Secondary | ICD-10-CM

## 2021-09-27 DIAGNOSIS — Z9889 Other specified postprocedural states: Secondary | ICD-10-CM | POA: Insufficient documentation

## 2021-09-27 LAB — WET PREP FOR TRICH, YEAST, CLUE
Trichomonas Exam: NEGATIVE
Yeast Exam: NEGATIVE

## 2021-09-27 MED ORDER — MEDROXYPROGESTERONE ACETATE 150 MG/ML IM SUSP
150.0000 mg | INTRAMUSCULAR | Status: AC
Start: 2021-09-27 — End: 2022-06-27
  Administered 2021-09-27 – 2022-06-27 (×4): 150 mg via INTRAMUSCULAR

## 2021-09-27 NOTE — Progress Notes (Signed)
Pt here for PE and Depo.  Wet mount results reviewed, no treatment required.   Depo given in Lt Deltoid without any complications.  Pt given reminder card to return in 11-13 weeks for next Depo injection.  Berdie Ogren, RN

## 2021-09-27 NOTE — Progress Notes (Signed)
[facility] DEPARTMENT [facility] [address] Main Number: [phone]    Family Planning Visit- Initial Visit  Subjective:  Evelyn Stevenson is a 35 y.o. SBF smoker G2P2002 (15, 2)  being seen today for an initial annual visit and to discuss contraceptive options.  The patient is currently using Depo Provera for pregnancy prevention. Patient reports she does not want a pregnancy in the next year.  Patient has the following medical conditions has Family history of autism; History of cesarean delivery; Obesity,  BMI=43.1; Asthma; and H/O LEEP age 72 on their problem list.  Chief Complaint  Patient presents with   Annual Exam   Contraception    Patient reports here for physical and DMPA. LMP today. Last sex 06/2021 with condom; not with partner anymore. Last pap 03/16/16 neg. Last DMPA 07/01/21. Hx LEEP age 64. Smoking 4-10 cpd. Last MJ 09/24/21. Last ETOH 09/24/21 (4 shots Tequila) 2x/mo. Thinking about BTL after counseling for weight gain. Living with her 2 kids and 62 yo nephew. Working 40 hrs/wk  Patient denies cigars, vaping  Body mass index is 43.15 kg/m. - Patient is eligible for diabetes screening based on BMI and age >56?  not applicable HA1C ordered? not applicable  Patient reports 1  partner/s in last year. Desires STI screening?  No - declines bloodwork  Has patient been screened once for HCV in the past?  No  No results found for: HCVAB  Does the patient have current drug use (including MJ), have a partner with drug use, and/or has been incarcerated since last result? Yes  If yes-- Screen for HCV through [facility]   Does the patient meet criteria for HBV testing? No  Criteria:  -Household, sexual or needle sharing contact with HBV -History of drug use -HIV positive -Those with known Hep C   Health Maintenance Due  Topic Date Due   COVID-19 Vaccine (1) Never done   Pneumococcal Vaccine 40-75 Years old (1 - PCV) Never done    Hepatitis C Screening  Never done   PAP SMEAR-Modifier  04/25/2021   INFLUENZA VACCINE  Never done    Review of Systems  Constitutional:  Positive for weight loss (40-50 lbs wt gain in 2 years; walks 5x/wk x 15 min).  All other systems reviewed and are negative.  The following portions of the patient's history were reviewed and updated as appropriate: allergies, current medications, past family history, past medical history, past social history, past surgical history and problem list. Problem list updated.   See flowsheet for other program required questions.  Objective:   Vitals:   09/27/21 1529  BP: 120/79  Weight: 292 lb 3.2 oz (132.5 kg)  Height: 5\' 9"  (1.753 m)    Physical Exam Constitutional:      Appearance: Normal appearance. She is obese.  HENT:     Head: Normocephalic and atraumatic.     Mouth/Throat:     Mouth: Mucous membranes are moist.     Comments: Last dental exam 8 mo ago Eyes:     Conjunctiva/sclera: Conjunctivae normal.  Neck:     Thyroid: No thyroid mass, thyromegaly or thyroid tenderness.  Cardiovascular:     Rate and Rhythm: Normal rate and regular rhythm.  Pulmonary:     Effort: Pulmonary effort is normal.     Breath sounds: Normal breath sounds.  Chest:  Breasts:    Right: Normal.     Left: Normal.  Abdominal:  Palpations: Abdomen is soft.     Comments: Soft without masses or tenderness, poor tone, increased adipose  Genitourinary:    General: Normal vulva.     Exam position: Lithotomy position.     Vagina: Normal.     Cervix: Friability (friable to pap, sl ecropian) present.     Uterus: Normal.      Rectum: Normal.     Comments: Pap done Difficult to assess on bimanual due to increased adipose Musculoskeletal:        General: Normal range of motion.     Cervical back: Normal range of motion and neck supple.  Skin:    General: Skin is warm and dry.  Neurological:     Mental Status: She is alert.  Psychiatric:        Mood  and Affect: Mood normal.      Assessment and Plan:  Evelyn Stevenson is a 35 y.o. female presenting to the [facility] Department for an initial annual wellness/contraceptive visit  Contraception counseling: Reviewed all forms of birth control options in the tiered based approach. available including abstinence; over the counter/barrier methods; hormonal contraceptive medication including pill, patch, ring, injection,contraceptive implant, ECP; hormonal and nonhormonal IUDs; permanent sterilization options including vasectomy and the various tubal sterilization modalities. Risks, benefits, and typical effectiveness rates were reviewed.  Questions were answered.  Written information was also given to the patient to review.  Patient desires continuation of DMPA, this was prescribed for patient. She will follow up in  11-13 wks for surveillance.  She was told to call with any further questions, or with any concerns about this method of contraception.  Emphasized use of condoms 100% of the time for STI prevention.  Patient was offered ECP. ECP was not accepted by the patient. ECP counseling was not given - see RN documentation  1. Family planning Pt to call [facility] or [facility] for BTL Treat wet mount per standing orders Immunization nurse consult Please give pt primary care MD list  - WET PREP FOR TRICH, YEAST, CLUE - Chlamydia/Gonorrhea [place] [facility] - IGP, Aptima HPV  2. Encounter for surveillance of injectable contraceptive May have DMPA 150 mg IM q 11-13 wks x 1 year   3. H/O LEEP age 35      No follow-ups on file.  No future appointments.  Alberteen Spindle, CNM

## 2021-10-11 ENCOUNTER — Encounter: Payer: Self-pay | Admitting: Advanced Practice Midwife

## 2021-10-11 LAB — IGP, APTIMA HPV
HPV Aptima: POSITIVE — AB
PAP Smear Comment: 0

## 2021-10-12 ENCOUNTER — Telehealth: Payer: Self-pay | Admitting: Family Medicine

## 2021-10-12 NOTE — Telephone Encounter (Signed)
Pt wants someone to call and explain results of pap

## 2021-10-12 NOTE — Telephone Encounter (Signed)
Return call to patient this afternoon to discuss where we were in the process of her phone call earlier.  I let her know that the [facility] results feed in quicker than the provider can review the results and that once the provider reviews her results and initiates the plan of care they will then route it to me as the PAP nurse and I will then call her to discuss.  I explained to her we were in process and hopefully it would be today or Monday due to the Holidays on Thursday and Friday.  She verbalized understanding.  She was very concerned due to it being abnormal via MyChart.  I also told her not to fret, that just because they were abnormal did not mean for her to fret right now and we would get her to where she needed to be if that was the plan for her or we would see her based on what the plan called for.  She seemed relieved and verbalized understanding. Hart Carwin, RN

## 2021-10-17 ENCOUNTER — Encounter: Payer: Self-pay | Admitting: Advanced Practice Midwife

## 2021-10-17 ENCOUNTER — Telehealth: Payer: Self-pay | Admitting: Family Medicine

## 2021-10-17 DIAGNOSIS — R87619 Unspecified abnormal cytological findings in specimens from cervix uteri: Secondary | ICD-10-CM

## 2021-10-17 HISTORY — DX: Unspecified abnormal cytological findings in specimens from cervix uteri: R87.619

## 2021-10-17 NOTE — Telephone Encounter (Signed)
Telephone call to patient today regarding her PAP results.  ASCUS and + HPV.  Colpo referral needed per Arnetha Courser, CNM.  Colpo referral sent to [facility] today per patient request.  Hart Carwin, RN

## 2021-10-17 NOTE — Telephone Encounter (Signed)
Telephone call completed. Hart Carwin, RN

## 2021-10-17 NOTE — Telephone Encounter (Signed)
Pt would like to speak with a nurse about her PAP results. Please call.

## 2021-11-10 ENCOUNTER — Ambulatory Visit (INDEPENDENT_AMBULATORY_CARE_PROVIDER_SITE_OTHER): Payer: Medicaid Other | Admitting: Obstetrics and Gynecology

## 2021-11-10 ENCOUNTER — Other Ambulatory Visit (HOSPITAL_COMMUNITY)
Admission: RE | Admit: 2021-11-10 | Discharge: 2021-11-10 | Disposition: A | Discharge status: Home / Self Care | Payer: Medicaid Other | Source: Ambulatory Visit | Attending: Obstetrics and Gynecology | Admitting: Obstetrics and Gynecology

## 2021-11-10 ENCOUNTER — Other Ambulatory Visit: Payer: Self-pay

## 2021-11-10 ENCOUNTER — Encounter: Payer: Self-pay | Admitting: Obstetrics and Gynecology

## 2021-11-10 VITALS — BP 128/70 | Ht 69.0 in | Wt 287.2 lb

## 2021-11-10 DIAGNOSIS — R87610 Atypical squamous cells of undetermined significance on cytologic smear of cervix (ASC-US): Secondary | ICD-10-CM | POA: Diagnosis not present

## 2021-11-10 NOTE — Progress Notes (Signed)
° °  GYNECOLOGY CLINIC COLPOSCOPY PROCEDURE NOTE  35 y.o. 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 Discussed underlying role for HPV infection in the development of cervical dysplasia, its natural history and progression/regression, need for surveillance.  Is the patient  pregnant: No LMP: Patient's last menstrual period was 09/25/2021. Smoking status:  reports that she has been smoking cigarettes. She has been smoking an average of .5 packs per day. She has never used smokeless tobacco. Contraception: condoms and OCP (estrogen/progesterone)  Patient given informed consent, signed copy in the chart, time out was performed.  The patient was position in dorsal lithotomy position. Speculum was placed the cervix was visualized.   After application of acetic acid colposcopic inspection of the cervix was undertaken.   Colposcopy adequate, full visualization of transformation zone: No acetowhite lesion(s) noted at 12 and 2 o'clock; corresponding biopsies obtained.   ECC specimen obtained:  Yes  All specimens were labeled and sent to pathology.  She reports a history of a LEEP at 79.  Encouraged smoking cessation Received HPV vaccination today. Encouraged to return to complete the series.    Patient was given post procedure instructions.  Will follow up pathology and manage accordingly.  Routine preventative health maintenance measures emphasized.  Physical Exam Genitourinary:       Adelene Idler MD [facility], [facility] 11/10/2021 2:39 PM

## 2021-11-10 NOTE — Patient Instructions (Signed)
Colposcopy, Care After The following information offers guidance on how to care for yourself after your procedure. Your doctor may also give you more specific instructions. If you have problems or questions, contact your doctor. What can I expect after the procedure? If you did not have a sample of your tissue taken out (did not have a biopsy), you may only have some spotting of blood for a few days. You can go back to your normal activities. If you had a sample of your tissue taken out, it is common to have: Soreness and mild pain. These may last for a few days. Mild bleeding or fluid (discharge) coming from your vagina. The fluid will look dark and grainy. You may have this for a few days. The fluid may be caused by a liquid that was used during your procedure. You may need to wear a sanitary pad. Spotting of blood for at least 48 hours after the procedure. Follow these instructions at home: Medicines Take over-the-counter and prescription medicines only as told by your doctor. Ask your doctor what over-the-counter pain medicines and prescription medicines you can start taking again. This is very important if you take blood thinners. Activity For at least 3 days, or for as long as told by your doctor, avoid: Douching. Using tampons. Having sex. Return to your normal activities as told by your doctor. Ask your doctor what activities are safe for you. General instructions Ask your doctor if you may take baths, swim, or use a hot tub. You may take showers. If you use birth control (contraception), keep using it. Keep all follow-up visits. Contact a doctor if: You have a fever or chills. You faint or feel light-headed. Get help right away if: You bleed a lot from your vagina. A lot of bleeding means that the bleeding soaks through a pad in less than 1 hour. You have clumps of blood (blood clots) coming from your vagina. You have signs that could mean you have an infection. This may be fluid  coming from your vagina that is: Different than normal. Yellow. Bad-smelling. You have very bad pain or cramps in your lower belly that do not get better with medicine. Summary If you did not have a sample of your tissue taken out, you may only have some spotting of blood for a few days. You can go back to your normal activities. If you had a sample of your tissue taken out, it is common to have mild pain for a few days and spotting for 48 hours. Avoid douching, using tampons, and having sex for at least 3 days after the procedure or for as long as told. Get help right away if you have a lot of bleeding, very bad pain, or signs of infection. This information is not intended to replace advice given to you by your health care provider. Make sure you discuss any questions you have with your health care provider. Document Revised: 04/03/2021 Document Reviewed: 04/03/2021 Elsevier Patient Education  2022 Elsevier Inc.  

## 2021-11-15 LAB — SURGICAL PATHOLOGY

## 2021-11-24 ENCOUNTER — Telehealth: Payer: Self-pay | Admitting: Obstetrics and Gynecology

## 2021-11-24 NOTE — Telephone Encounter (Signed)
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

## 2021-11-24 NOTE — Telephone Encounter (Signed)
Spoke with the patient, surgery is scheduled for 01/17/22, Pre-admit Testing phone interview to be scheduled (patient will watch for MyChart notification), and Post Op on 3/27 @ 11:15am. Patient confirmed [place] Medicaid [employer].

## 2021-12-22 ENCOUNTER — Ambulatory Visit: Payer: Medicaid Other

## 2022-01-02 ENCOUNTER — Ambulatory Visit: Payer: Medicaid Other

## 2022-01-06 ENCOUNTER — Encounter
Admission: RE | Admit: 2022-01-06 | Discharge: 2022-01-06 | Disposition: A | Discharge status: Home / Self Care | Payer: Medicaid Other | Source: Ambulatory Visit | Attending: Obstetrics and Gynecology | Admitting: Obstetrics and Gynecology

## 2022-01-06 ENCOUNTER — Other Ambulatory Visit: Payer: Self-pay

## 2022-01-06 HISTORY — DX: Other specified postprocedural states: R11.2

## 2022-01-06 HISTORY — DX: Other specified postprocedural states: Z98.890

## 2022-01-06 NOTE — Patient Instructions (Addendum)
Your procedure is scheduled on: Tuesday, February 28 Report to the Registration Desk on the 1st floor of the [facility]. To find out your arrival time, please call [phone] between 1PM - 3PM on: Monday, February 27  REMEMBER: Instructions that are not followed completely may result in serious medical risk, up to and including death; or upon the discretion of your surgeon and anesthesiologist your surgery may need to be rescheduled.  Do not eat food after midnight the night before surgery.  No gum chewing, lozengers or hard candies.  You may however, drink CLEAR liquids up to 2 hours before you are scheduled to arrive for your surgery. Do not drink anything within 2 hours of your scheduled arrival time.  Clear liquids include: - water  - apple juice without pulp - gatorade (not RED, PURPLE, OR BLUE) - black coffee or tea (Do NOT add milk or creamers to the coffee or tea) Do NOT drink anything that is not on this list.  DO NOT TAKE ANY MEDICATIONS THE MORNING OF SURGERY   One week prior to surgery: starting February 21 Stop Anti-inflammatories (NSAIDS) such as Advil, Aleve, Ibuprofen, Motrin, Naproxen, Naprosyn and Aspirin based products such as Excedrin, Goodys Powder, BC Powder. Stop ANY OVER THE COUNTER supplements until after surgery. You may however, continue to take Tylenol if needed for pain up until the day of surgery.  No Alcohol for 24 hours before or after surgery.  No Smoking including e-cigarettes for 24 hours prior to surgery.  No chewable tobacco products for at least 6 hours prior to surgery.  No nicotine patches on the day of surgery.  Do not use any "recreational" drugs for at least a week prior to your surgery.  Please be advised that the combination of cocaine and anesthesia may have negative outcomes, up to and including death. If you test positive for cocaine, your surgery will be cancelled.  On the morning of surgery brush your teeth with toothpaste  and water, you may rinse your mouth with mouthwash if you wish. Do not swallow any toothpaste or mouthwash.  Do not wear jewelry, make-up, hairpins, clips or nail polish.  Do not wear lotions, powders, or perfumes.   Do not shave body from the neck down 48 hours prior to surgery just in case you cut yourself which could leave a site for infection.   Contact lenses, hearing aids and dentures may not be worn into surgery.  Do not bring valuables to the hospital. [facility] is not responsible for any missing/lost belongings or valuables.   Notify your doctor if there is any change in your medical condition (cold, fever, infection).  Wear comfortable clothing (specific to your surgery type) to the hospital.  After surgery, you can help prevent lung complications by doing breathing exercises.  Take deep breaths and cough every 1-2 hours. Your doctor may order a device called an Incentive Spirometer to help you take deep breaths.  If you are being discharged the day of surgery, you will not be allowed to drive home. You will need a responsible adult (18 years or older) to drive you home and stay with you that night.   If you are taking public transportation, you will need to have a responsible adult (18 years or older) with you. Please confirm with your physician that it is acceptable to use public transportation.   Please call the [place] Dept. at [phone] if you have any questions about these instructions.  Surgery  Visitation Policy:  Patients undergoing a surgery or procedure may have one family member or support person with them as long as that person is not COVID-19 positive or experiencing its symptoms.  That person may remain in the waiting area during the procedure and may rotate out with other people.

## 2022-01-12 ENCOUNTER — Ambulatory Visit (LOCAL_COMMUNITY_HEALTH_CENTER): Payer: Medicaid Other

## 2022-01-12 ENCOUNTER — Other Ambulatory Visit: Payer: Self-pay

## 2022-01-12 VITALS — BP 123/82 | HR 91 | Ht 69.0 in | Wt 289.0 lb

## 2022-01-12 DIAGNOSIS — Z30013 Encounter for initial prescription of injectable contraceptive: Secondary | ICD-10-CM

## 2022-01-12 DIAGNOSIS — Z3009 Encounter for other general counseling and advice on contraception: Secondary | ICD-10-CM | POA: Diagnosis not present

## 2022-01-12 NOTE — Progress Notes (Signed)
Client tolerated Depo injection without complaint. Lyvia Mondesir, RN  

## 2022-01-16 MED ORDER — ACETAMINOPHEN 500 MG PO TABS
1000.0000 mg | ORAL_TABLET | Freq: Once | ORAL | Status: AC
Start: 2022-01-16 — End: 2022-01-17

## 2022-01-16 MED ORDER — POVIDONE-IODINE 10 % EX SWAB
2.0000 "application " | Freq: Once | CUTANEOUS | Status: AC
  Administered 2022-01-17: 2 via TOPICAL

## 2022-01-16 MED ORDER — LACTATED RINGERS IV SOLN
INTRAVENOUS | Status: DC
Start: 2022-01-16 — End: 2022-01-17

## 2022-01-16 MED ORDER — FAMOTIDINE 20 MG PO TABS: 20.0000 mg | ORAL_TABLET | Freq: Once | ORAL | Status: AC

## 2022-01-16 MED ORDER — CHLORHEXIDINE GLUCONATE 0.12 % MT SOLN: 15.0000 mL | Freq: Once | OROMUCOSAL | Status: AC

## 2022-01-16 MED ORDER — APREPITANT 40 MG PO CAPS: 40.0000 mg | ORAL_CAPSULE | Freq: Once | ORAL | Status: AC

## 2022-01-16 MED ORDER — ORAL CARE MOUTH RINSE
15.0000 mL | Freq: Once | OROMUCOSAL | Status: AC
Start: 2022-01-16 — End: 2022-01-17

## 2022-01-16 MED ORDER — CELECOXIB 200 MG PO CAPS: 200.0000 mg | ORAL_CAPSULE | Freq: Once | ORAL | Status: AC

## 2022-01-16 NOTE — Anesthesia Preprocedure Evaluation (Addendum)
Anesthesia Evaluation  Patient identified by MRN, date of birth, ID band Patient awake    Reviewed: Allergy & Precautions, NPO status , Patient's Chart, lab work & pertinent test results  History of Anesthesia Complications (+) PONV and history of anesthetic complications  Airway Mallampati: III  TM Distance: >3 FB Neck ROM: full    Dental no notable dental hx.    Pulmonary asthma (childhood asthma) , Current Smoker and Patient abstained from smoking.,    Pulmonary exam normal        Cardiovascular negative cardio ROS Normal cardiovascular exam     Neuro/Psych negative neurological ROS  negative psych ROS   GI/Hepatic negative GI ROS, (+)     substance abuse  marijuana use,   Endo/Other  negative endocrine ROS  Renal/GU negative Renal ROS  negative genitourinary   Musculoskeletal   Abdominal (+) + obese,   Peds  Hematology negative hematology ROS (+)   Anesthesia Other Findings Past Medical History: 10/17/2021: Abnormal Pap smear of cervix  09/27/21 ASCUS HPV+ No date: Asthma     Comment:  as a child No date: Heart murmur No date: PONV (postoperative nausea and vomiting)  Past Surgical History: 2007: CESAREAN SECTION 2005: LEEP     Reproductive/Obstetrics negative OB ROS                            Anesthesia Physical Anesthesia Plan  ASA: 3  Anesthesia Plan: General   Post-op Pain Management: Celebrex PO (pre-op)*, Gabapentin PO (pre-op)* and Tylenol PO (pre-op)*   Induction: Intravenous  PONV Risk Score and Plan: Propofol infusion and TIVA  Airway Management Planned: LMA  Additional Equipment:   Intra-op Plan:   Post-operative Plan: Extubation in OR  Informed Consent: I have reviewed the patients History and Physical, chart, labs and discussed the procedure including the risks, benefits and alternatives for the proposed anesthesia with the patient or authorized  representative who has indicated his/her understanding and acceptance.     Dental advisory given  Plan Discussed with: Anesthesiologist, CRNA and Surgeon  Anesthesia Plan Comments:        Anesthesia Quick Evaluation

## 2022-01-17 ENCOUNTER — Ambulatory Visit
Admission: RE | Admit: 2022-01-17 | Discharge: 2022-01-17 | Disposition: A | Discharge status: Home / Self Care | Payer: Medicaid Other | Attending: Obstetrics and Gynecology | Admitting: Obstetrics and Gynecology

## 2022-01-17 ENCOUNTER — Encounter: Payer: Self-pay | Admitting: Obstetrics and Gynecology

## 2022-01-17 ENCOUNTER — Ambulatory Visit: Payer: Medicaid Other | Admitting: Anesthesiology

## 2022-01-17 ENCOUNTER — Encounter
Admission: RE | Disposition: A | Discharge status: Home / Self Care | Payer: Self-pay | Source: Home / Self Care | Attending: Obstetrics and Gynecology

## 2022-01-17 ENCOUNTER — Other Ambulatory Visit: Payer: Self-pay

## 2022-01-17 DIAGNOSIS — Z793 Long term (current) use of hormonal contraceptives: Secondary | ICD-10-CM | POA: Diagnosis not present

## 2022-01-17 DIAGNOSIS — D061 Carcinoma in situ of exocervix: Secondary | ICD-10-CM | POA: Diagnosis not present

## 2022-01-17 DIAGNOSIS — D060 Carcinoma in situ of endocervix: Secondary | ICD-10-CM | POA: Insufficient documentation

## 2022-01-17 DIAGNOSIS — F1290 Cannabis use, unspecified, uncomplicated: Secondary | ICD-10-CM | POA: Diagnosis not present

## 2022-01-17 DIAGNOSIS — F17210 Nicotine dependence, cigarettes, uncomplicated: Secondary | ICD-10-CM | POA: Insufficient documentation

## 2022-01-17 DIAGNOSIS — N871 Moderate cervical dysplasia: Secondary | ICD-10-CM

## 2022-01-17 HISTORY — PX: LEEP: SHX91

## 2022-01-17 LAB — CBC
HCT: 40.6 % (ref 36.0–46.0)
Hemoglobin: 13.7 g/dL (ref 12.0–15.0)
MCH: 29.9 pg (ref 26.0–34.0)
MCHC: 33.7 g/dL (ref 30.0–36.0)
MCV: 88.6 fL (ref 80.0–100.0)
Platelets: 216 K/uL (ref 150–400)
RBC: 4.58 MIL/uL (ref 3.87–5.11)
RDW: 13.4 % (ref 11.5–15.5)
WBC: 7.1 K/uL (ref 4.0–10.5)
nRBC: 0 % (ref 0.0–0.2)

## 2022-01-17 LAB — TYPE AND SCREEN
ABO/RH(D): O POS
Antibody Screen: NEGATIVE

## 2022-01-17 LAB — POCT PREGNANCY, URINE: Preg Test, Ur: NEGATIVE

## 2022-01-17 SURGERY — LEEP (LOOP ELECTROSURGICAL EXCISION PROCEDURE)
Anesthesia: General

## 2022-01-17 MED ORDER — PROPOFOL 10 MG/ML IV BOLUS
INTRAVENOUS | Status: DC | PRN
  Administered 2022-01-17: 40 mg via INTRAVENOUS

## 2022-01-17 MED ORDER — CELECOXIB 200 MG PO CAPS
ORAL_CAPSULE | ORAL | Status: AC
  Administered 2022-01-17: 200 mg via ORAL
  Filled 2022-01-17: qty 1

## 2022-01-17 MED ORDER — DEXAMETHASONE SODIUM PHOSPHATE 10 MG/ML IJ SOLN
INTRAMUSCULAR | Status: AC
  Filled 2022-01-17: qty 1

## 2022-01-17 MED ORDER — MIDAZOLAM HCL 2 MG/2ML IJ SOLN
INTRAMUSCULAR | Status: AC
  Filled 2022-01-17: qty 2

## 2022-01-17 MED ORDER — SILVER NITRATE-POT NITRATE 75-25 % EX MISC
CUTANEOUS | Status: AC
  Filled 2022-01-17: qty 10

## 2022-01-17 MED ORDER — FERRIC SUBSULFATE 259 MG/GM EX SOLN
CUTANEOUS | Status: AC
  Filled 2022-01-17: qty 8

## 2022-01-17 MED ORDER — ACETAMINOPHEN 500 MG PO TABS
ORAL_TABLET | ORAL | Status: AC
  Administered 2022-01-17: 1000 mg via ORAL
  Filled 2022-01-17: qty 2

## 2022-01-17 MED ORDER — FERRIC SUBSULFATE 259 MG/GM EX SOLN
CUTANEOUS | Status: DC | PRN
  Administered 2022-01-17: 1

## 2022-01-17 MED ORDER — IODINE STRONG (LUGOLS) 5 % PO SOLN
ORAL | Status: AC
  Filled 2022-01-17: qty 1

## 2022-01-17 MED ORDER — APREPITANT 40 MG PO CAPS
ORAL_CAPSULE | ORAL | Status: AC
  Administered 2022-01-17: 40 mg via ORAL
  Filled 2022-01-17: qty 1

## 2022-01-17 MED ORDER — IODINE STRONG (LUGOLS) 5 % PO SOLN
ORAL | Status: DC | PRN
Start: 2022-01-17 — End: 2022-01-17
  Administered 2022-01-17: 0.2 mL via ORAL

## 2022-01-17 MED ORDER — CHLORHEXIDINE GLUCONATE 0.12 % MT SOLN
OROMUCOSAL | Status: AC
  Administered 2022-01-17: 15 mL via OROMUCOSAL
  Filled 2022-01-17: qty 15

## 2022-01-17 MED ORDER — MIDAZOLAM HCL 2 MG/2ML IJ SOLN
INTRAMUSCULAR | Status: DC | PRN
  Administered 2022-01-17 (×2): 1 mg via INTRAVENOUS

## 2022-01-17 MED ORDER — FENTANYL CITRATE (PF) 100 MCG/2ML IJ SOLN
INTRAMUSCULAR | Status: AC
  Filled 2022-01-17: qty 2

## 2022-01-17 MED ORDER — LIDOCAINE HCL (PF) 2 % IJ SOLN
INTRAMUSCULAR | Status: AC
  Filled 2022-01-17: qty 5

## 2022-01-17 MED ORDER — ONDANSETRON HCL 4 MG/2ML IJ SOLN
INTRAMUSCULAR | Status: AC
  Filled 2022-01-17: qty 2

## 2022-01-17 MED ORDER — PROPOFOL 500 MG/50ML IV EMUL
INTRAVENOUS | Status: DC | PRN
  Administered 2022-01-17: 175 ug/kg/min via INTRAVENOUS

## 2022-01-17 MED ORDER — PHENYLEPHRINE HCL-NACL 20-0.9 MG/250ML-% IV SOLN
INTRAVENOUS | Status: AC
Start: 2022-01-17 — End: ?
  Filled 2022-01-17: qty 250

## 2022-01-17 MED ORDER — ONDANSETRON HCL 4 MG/2ML IJ SOLN
INTRAMUSCULAR | Status: DC | PRN
  Administered 2022-01-17: 4 mg via INTRAVENOUS

## 2022-01-17 MED ORDER — FENTANYL CITRATE (PF) 100 MCG/2ML IJ SOLN
INTRAMUSCULAR | Status: DC | PRN
  Administered 2022-01-17 (×4): 50 ug via INTRAVENOUS

## 2022-01-17 MED ORDER — LIDOCAINE-EPINEPHRINE 1 %-1:100000 IJ SOLN
INTRAMUSCULAR | Status: AC
  Filled 2022-01-17: qty 1

## 2022-01-17 MED ORDER — FAMOTIDINE 20 MG PO TABS
ORAL_TABLET | ORAL | Status: AC
  Administered 2022-01-17: 20 mg via ORAL
  Filled 2022-01-17: qty 1

## 2022-01-17 MED ORDER — LACTATED RINGERS IV SOLN: INTRAVENOUS | Status: DC

## 2022-01-17 MED ORDER — PROPOFOL 10 MG/ML IV BOLUS
INTRAVENOUS | Status: AC
  Filled 2022-01-17: qty 20

## 2022-01-17 MED ORDER — ACETIC ACID 3 % SOLN
Status: DC | PRN
  Administered 2022-01-17: 1 via TOPICAL

## 2022-01-17 MED ORDER — DEXMEDETOMIDINE (PRECEDEX) IN NS 20 MCG/5ML (4 MCG/ML) IV SYRINGE
PREFILLED_SYRINGE | INTRAVENOUS | Status: DC | PRN
  Administered 2022-01-17: 6 ug via INTRAVENOUS
  Administered 2022-01-17: 4 ug via INTRAVENOUS
  Administered 2022-01-17: 10 ug via INTRAVENOUS

## 2022-01-17 MED ORDER — SEVOFLURANE IN SOLN
RESPIRATORY_TRACT | Status: AC
  Filled 2022-01-17: qty 250

## 2022-01-17 MED ORDER — ROCURONIUM BROMIDE 10 MG/ML (PF) SYRINGE
PREFILLED_SYRINGE | INTRAVENOUS | Status: AC
Start: 2022-01-17 — End: ?
  Filled 2022-01-17: qty 10

## 2022-01-17 MED ORDER — DEXMEDETOMIDINE HCL IN NACL 200 MCG/50ML IV SOLN
INTRAVENOUS | Status: AC
  Filled 2022-01-17: qty 50

## 2022-01-17 MED ORDER — 0.9 % SODIUM CHLORIDE (POUR BTL) OPTIME
TOPICAL | Status: DC | PRN
Start: 2022-01-17 — End: 2022-01-17
  Administered 2022-01-17: 500 mL

## 2022-01-17 MED ORDER — ACETIC ACID 3 % SOLN
Status: AC
  Filled 2022-01-17: qty 500

## 2022-01-17 MED ORDER — LIDOCAINE-EPINEPHRINE 1 %-1:100000 IJ SOLN
INTRAMUSCULAR | Status: DC | PRN
  Administered 2022-01-17: 20 mL

## 2022-01-17 MED ORDER — PROPOFOL 10 MG/ML IV BOLUS
INTRAVENOUS | Status: AC
  Filled 2022-01-17: qty 40

## 2022-01-17 MED ORDER — DEXAMETHASONE SODIUM PHOSPHATE 10 MG/ML IJ SOLN
INTRAMUSCULAR | Status: DC | PRN
Start: 2022-01-17 — End: 2022-01-17
  Administered 2022-01-17: 10 mg via INTRAVENOUS

## 2022-01-17 SURGICAL SUPPLY — 47 items
APL SWBSTK 6 STRL LF DISP (MISCELLANEOUS) ×1
APPLICATOR COTTON TIP 6 STRL (MISCELLANEOUS) ×1 IMPLANT
APPLICATOR COTTON TIP 6IN STRL (MISCELLANEOUS) ×2
APPLICATOR SWAB PROCTO LG 16IN (MISCELLANEOUS) ×2 IMPLANT
BACTOSHIELD CHG 4% 4OZ (MISCELLANEOUS) ×1
BLADE SURG SZ11 CARB STEEL (BLADE) ×4 IMPLANT
CATH ROBINSON RED A/P 16FR (CATHETERS) ×2 IMPLANT
CNTNR SPEC 2.5X3XGRAD LEK (MISCELLANEOUS) ×1
CONT SPEC 4OZ STER OR WHT (MISCELLANEOUS) ×1
CONT SPEC 4OZ STRL OR WHT (MISCELLANEOUS) ×1
CONTAINER SPEC 2.5X3XGRAD LEK (MISCELLANEOUS) ×1 IMPLANT
DRAPE PERI LITHO V/GYN (MISCELLANEOUS) ×2 IMPLANT
DRAPE UNDER BUTTOCK W/FLU (DRAPES) ×2 IMPLANT
DRSG TELFA 3X8 NADH (GAUZE/BANDAGES/DRESSINGS) ×2 IMPLANT
ELECT LEEP BALL 5MM 12CM (MISCELLANEOUS) ×2
ELECT LEEP LOOP 20X10 R2010 (MISCELLANEOUS)
ELECT LOOP 1.0X1.0CM R1010 (MISCELLANEOUS)
ELECT REM PT RETURN 9FT ADLT (ELECTROSURGICAL) ×2
ELECTRODE LEEP BALL 5MM 12CM (MISCELLANEOUS) ×1 IMPLANT
ELECTRODE LEP LOOP 20X10 R2010 (MISCELLANEOUS) IMPLANT
ELECTRODE LOOP 1.0X1.0CM R1010 (MISCELLANEOUS) IMPLANT
ELECTRODE REM PT RTRN 9FT ADLT (ELECTROSURGICAL) ×1 IMPLANT
GAUZE 4X4 16PLY ~~LOC~~+RFID DBL (SPONGE) ×2 IMPLANT
GLOVE SURG SYN 6.5 ES PF (GLOVE) ×4 IMPLANT
GLOVE SURG SYN 6.5 PF PI (GLOVE) ×2 IMPLANT
GLOVE SURG UNDER POLY LF SZ6.5 (GLOVE) ×4 IMPLANT
GOWN STRL REUS W/ TWL LRG LVL3 (GOWN DISPOSABLE) ×2 IMPLANT
GOWN STRL REUS W/TWL LRG LVL3 (GOWN DISPOSABLE) ×4
KIT TURNOVER CYSTO (KITS) ×2 IMPLANT
MANIFOLD NEPTUNE II (INSTRUMENTS) ×2 IMPLANT
NDL SPNL 22GX3.5 QUINCKE BK (NEEDLE) ×1 IMPLANT
NEEDLE SPNL 22GX3.5 QUINCKE BK (NEEDLE) ×2 IMPLANT
NS IRRIG 500ML POUR BTL (IV SOLUTION) ×2 IMPLANT
PACK BASIN MINOR ARMC (MISCELLANEOUS) ×2 IMPLANT
PAD DRESSING TELFA 3X8 NADH (GAUZE/BANDAGES/DRESSINGS) ×1 IMPLANT
PAD OB MATERNITY 4.3X12.25 (PERSONAL CARE ITEMS) ×2 IMPLANT
PAD PREP 24X41 OB/GYN DISP (PERSONAL CARE ITEMS) ×2 IMPLANT
SCRUB CHG 4% DYNA-HEX 4OZ (MISCELLANEOUS) ×1 IMPLANT
SOL PREP PVP 2OZ (MISCELLANEOUS) ×2
SOLUTION PREP PVP 2OZ (MISCELLANEOUS) ×1 IMPLANT
SUT SILK 2 0 SH (SUTURE) ×2 IMPLANT
SUT VIC AB 0 CT1 36 (SUTURE) ×5 IMPLANT
SUT VIC AB 2-0 CT1 27 (SUTURE) ×2
SUT VIC AB 2-0 CT1 TAPERPNT 27 (SUTURE) ×2 IMPLANT
SYR 10ML LL (SYRINGE) ×2 IMPLANT
SYR CONTROL 10ML LL (SYRINGE) ×2 IMPLANT
WATER STERILE IRR 500ML POUR (IV SOLUTION) ×2 IMPLANT

## 2022-01-17 NOTE — Anesthesia Procedure Notes (Signed)
Procedure Name: LMA Insertion Date/Time: 01/17/2022 7:44 AM Performed by: Loletha Grayer, CRNA Pre-anesthesia Checklist: Patient identified, Patient being monitored, Timeout performed, Emergency Drugs available and Suction available Patient Re-evaluated:Patient Re-evaluated prior to induction Oxygen Delivery Method: Circle system utilized Preoxygenation: Pre-oxygenation with 100% oxygen Induction Type: IV induction Ventilation: Mask ventilation without difficulty LMA: LMA inserted LMA Size: 4.0 Number of attempts: 1 Placement Confirmation: positive ETCO2 Tube secured with: Tape Dental Injury: Teeth and Oropharynx as per pre-operative assessment

## 2022-01-17 NOTE — Anesthesia Postprocedure Evaluation (Signed)
Anesthesia Post Note  Patient: Evelyn Stevenson  Procedure(s) Performed: LOOP ELECTROSURGICAL EXCISION PROCEDURE (LEEP)  Patient location during evaluation: PACU Anesthesia Type: General Level of consciousness: awake and alert Pain management: pain level controlled Vital Signs Assessment: post-procedure vital signs reviewed and stable Respiratory status: spontaneous breathing, nonlabored ventilation and respiratory function stable Cardiovascular status: blood pressure returned to baseline and stable Postop Assessment: no apparent nausea or vomiting Anesthetic complications: no   No notable events documented.   Last Vitals:  Vitals:   01/17/22 0900 01/17/22 0911  BP: 119/69 126/74  Pulse: 80 78  Resp: 18 15  Temp: 36.4 C (!) 36.1 C  SpO2: 98% 100%    Last Pain:  Vitals:   01/17/22 0911  TempSrc: Temporal  PainSc: 0-No pain                 [place] Ouch

## 2022-01-17 NOTE — Progress Notes (Signed)
°   01/17/22 0700  °Clinical Encounter Type  °Visited With Patient  °Visit Type Initial;Pre-op  °Spiritual Encounters  °Spiritual Needs Prayer  ° °Chaplain provided pre-op support through compassionate presence, conversation and prayer °

## 2022-01-17 NOTE — Transfer of Care (Signed)
Immediate Anesthesia Transfer of Care Note  Patient: Evelyn Stevenson  Procedure(s) Performed: LOOP ELECTROSURGICAL EXCISION PROCEDURE (LEEP)  Patient Location: PACU  Anesthesia Type:General  Level of Consciousness: awake  Airway & Oxygen Therapy: Patient Spontanous Breathing and Patient connected to face mask oxygen  Post-op Assessment: Report given to RN and Post -op Vital signs reviewed and stable  Post vital signs: Reviewed and stable  Last Vitals:  Vitals Value Taken Time  BP    Temp    Pulse 78 01/17/22 0827  Resp 26 01/17/22 0827  SpO2 100 % 01/17/22 0827    Last Pain:  Vitals:   01/17/22 0631  TempSrc: Oral  PainSc: 0-No pain         Complications: No notable events documented.

## 2022-01-17 NOTE — H&P (Signed)
Evelyn Stevenson is an 36 y.o. female.   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  Pathology Report:  SURGICAL PATHOLOGY  CASE: [id number]  PATIENT: [facility]  Surgical Pathology Report  Clinical History: ASC-US (nt)   FINAL MICROSCOPIC DIAGNOSIS:   A. CERVIX, 12 O'CLOCK, BIOPSY:  - High-grade squamous intraepithelial lesion (CIN 2, moderate dysplasia)  - Negative for malignancy.   B. CERVIX, 2 O'CLOCK, BIOPSY:  - High-grade squamous intraepithelial lesion (CIN 2, moderate dysplasia)  - Negative for malignancy.   C. 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   Asthma    as a child   Heart murmur    PONV (postoperative nausea and vomiting)     Past Surgical History:  Procedure Laterality Date   CESAREAN SECTION  2007   LEEP  2005    Family History  Problem Relation Age of Onset   Hypertension Mother    Heart failure Mother    Hypertension Father    Diabetes Father    Cancer Maternal Grandmother    Arthritis Maternal Grandmother    Diabetes Maternal Grandmother    Hypertension Maternal Grandmother    COPD Maternal Grandmother    Prostate cancer Maternal Grandfather    Cirrhosis Maternal Grandfather    Hypertension Maternal Grandfather    Hypertension Paternal Grandmother    Hypertension Paternal Grandfather    Asthma Daughter    Breast cancer Neg Hx    Social History:  reports that she has been smoking cigarettes. She has been smoking an average of .5 packs per day. She has never used smokeless tobacco. She reports current alcohol use of about 4.0 standard drinks per week. She reports current drug use. Drug: Marijuana.  Allergies: No Known Allergies  Facility-Administered Medications Prior to  Admission  Medication Dose Route Frequency Provider Last Rate Last Admin   medroxyPROGESTERone (DEPO-PROVERA) injection 150 mg  150 mg Intramuscular Q90 days Herbie Saxon, CNM   150 mg at 01/12/22 1424   Medications Prior to Admission  Medication Sig Dispense Refill   ibuprofen (ADVIL,MOTRIN) 600 MG tablet Take 1 tablet (600 mg total) by mouth every 6 (six) hours as needed for mild pain, moderate pain or cramping. (Patient not taking: Reported on 01/12/2022) 30 tablet 0    Results for orders placed or performed during the hospital encounter of 01/17/22 (from the past 48 hour(s))  Pregnancy, urine POC     Status: None   Collection Time: 01/17/22  6:12 AM  Result Value Ref Range   Preg Test, Ur NEGATIVE NEGATIVE    Comment:        THE SENSITIVITY OF THIS METHODOLOGY IS >24 mIU/mL    No results found.  Review of Systems  Constitutional:  Negative for chills and fever.  HENT:  Negative for congestion, hearing loss and sinus pain.   Respiratory:  Negative for cough, shortness of breath and wheezing.   Cardiovascular:  Negative for chest pain, palpitations and leg swelling.  Gastrointestinal:  Negative for abdominal pain, constipation, diarrhea, nausea and vomiting.  Genitourinary:  Negative for dysuria, flank pain, frequency, hematuria and urgency.  Musculoskeletal:  Negative for back pain.  Skin:  Negative for rash.  Neurological:  Negative for dizziness and headaches.  Psychiatric/Behavioral:  Negative for suicidal ideas. The patient is not nervous/anxious.    Blood pressure 135/87, pulse 83, temperature 98.7 F (37.1 C), temperature source Oral, resp. rate 18, height 5\' 9"  (1.753 m), weight 131.1 kg, SpO2 98 %. Physical Exam Vitals and nursing note reviewed.  Constitutional:      Appearance: Normal appearance. She is well-developed.  HENT:     Head: Normocephalic and atraumatic.  Cardiovascular:     Rate and Rhythm: Normal rate and regular rhythm.  Pulmonary:      Effort: Pulmonary effort is normal.     Breath sounds: Normal breath sounds.  Abdominal:     General: Bowel sounds are normal.     Palpations: Abdomen is soft.  Musculoskeletal:        General: Normal range of motion.  Skin:    General: Skin is warm and dry.  Neurological:     Mental Status: She is alert and oriented to person, place, and time.  Psychiatric:        Behavior: Behavior normal.        Thought Content: Thought content normal.        Judgment: Judgment normal.     Assessment/Plan 35yo with CIN 2-3 Will proceed with LEEP as planned.  Discussed risk benefits and alternatives with the patient.  Discussed expected recovery and restriction of vaginal tampon or intercourse for 4 weeks after the procedure.  All questions answered and consent forms signed.  Homero Fellers, MD 01/17/2022, 7:28 AM

## 2022-01-17 NOTE — Op Note (Signed)
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    PROCEDURE IN DETAIL: After informed consent was obtained, the patient was taken to the operating room where general anesthesia was administered without difficulty. The patient was positioned in the dorsal lithotomy position in Higbee stirrups. Time-out was performed. The patient was examined under anesthesia, and the above findings were noted. She was prepped and draped in normal sterile fashion.  A weighted speculum and deaver were placed inside the patient's vagina. The above mentioned findings were noted. The anterior lip of the cervix was grasped with the single tooth tenaculum.    Paracervical block performed with 1% lidocaine with epinephrine. The cervical LEEP was then performed to excise the  ectocervix and endocervix using the Loop cautery. A post-LEEP  ECC was performed. The ball electrocautery was then used to obtain hemostasis. AstrinGyn was applied to the cervix.  All instruments were then removed from the vagina. Excellent hemostasis was obtained at the end of the procedure.    The patient tolerated the procedure well. Sponge, lap, needle, and instrument counts were correct x 2. The patient was transferred to the recovery room awake, alert and breathing independently in stable condition.  Adelene Idler MD, [facility], [facility] 01/17/2022 8:15 AM

## 2022-01-17 NOTE — Discharge Instructions (Addendum)
Nothing in the vagina for 4 weeks as the cervix heals.   AMBULATORY SURGERY  DISCHARGE INSTRUCTIONS   The drugs that you were given will stay in your system until tomorrow so for the next 24 hours you should not:  Drive an automobile Make any legal decisions Drink any alcoholic beverage   You may resume regular meals tomorrow.  Today it is better to start with liquids and gradually work up to solid foods.  You may eat anything you prefer, but it is better to start with liquids, then soup and crackers, and gradually work up to solid foods.   Please notify your doctor immediately if you have any unusual bleeding, trouble breathing, redness and pain at the surgery site, drainage, fever, or pain not relieved by medication.    Additional Instructions:        Please contact your physician with any problems or Same Day Surgery at [phone], Monday through Friday 6 am to 4 pm, or [place] [facility] number at [phone].

## 2022-01-19 LAB — SURGICAL PATHOLOGY

## 2022-02-13 ENCOUNTER — Encounter: Payer: Self-pay | Admitting: Obstetrics and Gynecology

## 2022-02-13 ENCOUNTER — Ambulatory Visit (INDEPENDENT_AMBULATORY_CARE_PROVIDER_SITE_OTHER): Payer: Medicaid Other | Admitting: Obstetrics and Gynecology

## 2022-02-13 ENCOUNTER — Encounter: Payer: Medicaid Other | Admitting: Obstetrics and Gynecology

## 2022-02-13 ENCOUNTER — Other Ambulatory Visit: Payer: Self-pay

## 2022-02-13 VITALS — BP 120/80 | Ht 69.0 in | Wt 287.0 lb

## 2022-02-13 DIAGNOSIS — Z7185 Encounter for immunization safety counseling: Secondary | ICD-10-CM | POA: Diagnosis not present

## 2022-02-13 DIAGNOSIS — Z23 Encounter for immunization: Secondary | ICD-10-CM

## 2022-02-13 DIAGNOSIS — Z4889 Encounter for other specified surgical aftercare: Secondary | ICD-10-CM | POA: Diagnosis not present

## 2022-02-13 NOTE — Patient Instructions (Signed)
Next HPV vaccination in June 2023 ?Next Pap Smear August 2023 ?

## 2022-02-13 NOTE — Progress Notes (Signed)
?  Postoperative Follow-up ?Patient presents post op from LEEP  for  CIN 2-3 , 1 month ago. ? ?Subjective: ?She reports that since the surgery she has been doing well. ?Eating a regular diet without difficulty.  ?The patient is not having any pain.   ?Activity: normal activities of daily living.  ? ?Objective: ?BP 120/80   Ht 5\' 9"  (1.753 m)   Wt 287 lb (130.2 kg)   LMP 01/17/2022   BMI 42.38 kg/m?  ?Physical Exam ?Constitutional:   ?   Appearance: She is well-developed.  ?Genitourinary:  ?   Genitourinary Comments: External: Normal appearing vulva. No lesions noted.  ?Speculum examination: Normal appearing cervix. Well healed. No blood. No discharge.   ?HENT:  ?   Head: Normocephalic and atraumatic.  ?Neck:  ?   Thyroid: No thyromegaly.  ?Cardiovascular:  ?   Rate and Rhythm: Normal rate and regular rhythm.  ?   Heart sounds: Normal heart sounds.  ?Pulmonary:  ?   Effort: Pulmonary effort is normal.  ?   Breath sounds: Normal breath sounds.  ?Abdominal:  ?   General: Bowel sounds are normal. There is no distension.  ?   Palpations: Abdomen is soft. There is no mass.  ?Musculoskeletal:  ?   Cervical back: Neck supple.  ?Neurological:  ?   Mental Status: She is alert and oriented to person, place, and time.  ?Skin: ?   General: Skin is warm and dry.  ?Psychiatric:     ?   Behavior: Behavior normal.     ?   Thought Content: Thought content normal.     ?   Judgment: Judgment normal.  ?Vitals reviewed.  ? ?Assessment: ?s/p :  LEEP stable ? ?Plan: ? ?Patient has done well after surgery with no apparent complications.  ? ?Follow up for pap smear in 6 months ?She received her second HPV vaccine today. ? ?I have discussed the post-operative course to date, and the expected progress moving forward.  The patient understands what complications to be concerned about.  I will see the patient in routine follow up, or sooner if needed.   ? ?Activity plan: No restriction..   ? ?Adrian Prows MD, ?[facility], [facility] ?02/13/2022 ?11:21 AM ? ? ? ?

## 2022-04-05 ENCOUNTER — Ambulatory Visit (LOCAL_COMMUNITY_HEALTH_CENTER): Payer: Medicaid Other

## 2022-04-05 VITALS — BP 125/84 | HR 87 | Ht 69.0 in

## 2022-04-05 DIAGNOSIS — Z3042 Encounter for surveillance of injectable contraceptive: Secondary | ICD-10-CM

## 2022-04-05 DIAGNOSIS — Z309 Encounter for contraceptive management, unspecified: Secondary | ICD-10-CM

## 2022-04-05 DIAGNOSIS — Z3009 Encounter for other general counseling and advice on contraception: Secondary | ICD-10-CM

## 2022-04-05 NOTE — Progress Notes (Signed)
11weeks 6 days post depo. Voices no concerns. Depo given today per order by E.Sciora,CNM dated 09-27-2021. Tolerated well Ldelt. Next depo due 06-21-2022./R.Elfie Costanza,RN ?

## 2022-04-10 ENCOUNTER — Ambulatory Visit: Payer: Medicaid Other

## 2022-06-27 ENCOUNTER — Ambulatory Visit (LOCAL_COMMUNITY_HEALTH_CENTER): Payer: Medicaid Other

## 2022-06-27 VITALS — BP 117/69 | Ht 69.0 in | Wt 290.5 lb

## 2022-06-27 DIAGNOSIS — Z3042 Encounter for surveillance of injectable contraceptive: Secondary | ICD-10-CM

## 2022-06-27 DIAGNOSIS — Z3009 Encounter for other general counseling and advice on contraception: Secondary | ICD-10-CM | POA: Diagnosis not present

## 2022-06-27 NOTE — Progress Notes (Signed)
11 weeks 6 days post depo. Denies any concerns other than weight gain. Says she is thinking of possibly having her tubes tied; resource list given.    Depo given IM right deltoid per order by E. Sciora CNM dated 09/27/21; tolerated well.  Next depo due 09/12/22 and appt reminder given.    Cherlynn Polo, RN

## 2022-10-17 ENCOUNTER — Ambulatory Visit (LOCAL_COMMUNITY_HEALTH_CENTER): Payer: Medicaid Other

## 2022-10-17 VITALS — BP 125/88 | Ht 69.0 in | Wt 290.0 lb

## 2022-10-17 DIAGNOSIS — Z3009 Encounter for other general counseling and advice on contraception: Secondary | ICD-10-CM

## 2022-10-17 DIAGNOSIS — Z309 Encounter for contraceptive management, unspecified: Secondary | ICD-10-CM | POA: Diagnosis not present

## 2022-10-17 DIAGNOSIS — Z30013 Encounter for initial prescription of injectable contraceptive: Secondary | ICD-10-CM | POA: Diagnosis not present

## 2022-10-17 DIAGNOSIS — Z3042 Encounter for surveillance of injectable contraceptive: Secondary | ICD-10-CM

## 2022-10-17 MED ORDER — MEDROXYPROGESTERONE ACETATE 150 MG/ML IM SUSP
150.0000 mg | Freq: Once | INTRAMUSCULAR | Status: AC
  Administered 2022-10-17: 150 mg via INTRAMUSCULAR

## 2022-10-17 NOTE — Progress Notes (Signed)
16 weeks post Depo.  Voices no concerns.  Bleeding only at the end of 3 months - prior to when hormonal injection due. Depo was given per protocol by Ralene Bathe, MD.  Given IM in left deltoid. Tolerated well. Last exam 09/27/2021.  Discussed time for return should be between 11 to 13 weeks.  Explained PE was past due and she needed appointment before next Depo or schedule exam at time of next Depo - 01/02/2023.

## 2022-10-26 ENCOUNTER — Ambulatory Visit: Payer: Medicaid Other

## 2023-01-22 ENCOUNTER — Ambulatory Visit (LOCAL_COMMUNITY_HEALTH_CENTER): Payer: Medicaid Other | Admitting: Family

## 2023-01-22 ENCOUNTER — Encounter: Payer: Self-pay | Admitting: Family

## 2023-01-22 VITALS — BP 113/79 | Ht 69.0 in | Wt 289.0 lb

## 2023-01-22 DIAGNOSIS — Z30013 Encounter for initial prescription of injectable contraceptive: Secondary | ICD-10-CM | POA: Diagnosis not present

## 2023-01-22 DIAGNOSIS — Z3042 Encounter for surveillance of injectable contraceptive: Secondary | ICD-10-CM

## 2023-01-22 DIAGNOSIS — Z309 Encounter for contraceptive management, unspecified: Secondary | ICD-10-CM

## 2023-01-22 DIAGNOSIS — Z01419 Encounter for gynecological examination (general) (routine) without abnormal findings: Secondary | ICD-10-CM

## 2023-01-22 MED ORDER — MEDROXYPROGESTERONE ACETATE 150 MG/ML IM SUSP
150.0000 mg | INTRAMUSCULAR | Status: AC
  Administered 2023-01-22 – 2024-01-28 (×5): 150 mg via INTRAMUSCULAR

## 2023-01-22 NOTE — Progress Notes (Signed)
Evelyn Stevenson [facility] Number: [phone]  Family Planning Visit- Repeat Yearly Visit  Subjective:  Evelyn Stevenson is a 37 y.o. G2P2002  being seen today for an annual wellness visit and to discuss contraception options.   The patient is currently using Hormonal Injection for pregnancy prevention. Patient does not want a pregnancy in the next year.   she/her/hers report they are looking for a method that provides High efficacy at preventing pregnancy   Patient has the following medical problems: has Family history of autism; History of cesarean delivery; Obesity,  BMI=43.1; Asthma; H/O LEEP age 24; Smoker 4-10 cpd; Abnormal Pap smear of cervix  09/27/21 ASCUS HPV+; and CIN II (cervical intraepithelial neoplasia II) on their problem list.  Chief Complaint  Patient presents with   Contraception    PE pap and depo     Patient reports here for her physical exam and Depo injection. Last Depo 10/17/2022. Last IC 12/21/2022. Full PE with CBE done 09/27/2021, with abnormal Pap result of ASCUS/HPV+, Leep performed on 01/17/2022 at [facility]. Due for F/U Pap today. Declines STI testing today.   See flowsheet for other program required questions.   Body mass index is 42.68 kg/m. - Patient is eligible for diabetes screening based on BMI> 25 and age >35?  yes HA1C ordered? No, patient declines today  Patient reports 1 of partners in last year. Desires STI screening?  No   Has patient been screened once for HCV in the past?  No  No results found for: "HCVAB"  Does the patient have current of drug use, have a partner with drug use, and/or has been incarcerated since last result? No  If yes-- Screen for HCV through [facility]   Does the patient meet criteria for HBV testing? No  Criteria:  -Household, sexual or needle sharing contact with HBV -History of drug use -HIV positive -Those with known Hep C   Health Maintenance  Due  Topic Date Due   COVID-19 Vaccine (1) Never done   Hepatitis C Screening  Never done   DTaP/Tdap/Td (1 - Tdap) Never done   HPV VACCINES (2 - 3-dose SCDM series) 03/13/2022   INFLUENZA VACCINE  Never done    Review of Systems  All other systems reviewed and are negative.   The following portions of the patient's history were reviewed and updated as appropriate: allergies, current medications, past family history, past medical history, past social history, past surgical history and problem list. Problem list updated.  Objective:   Vitals:   01/22/23 1308  BP: 113/79  Weight: 289 lb (131.1 kg)  Height: '5\' 9"'$  (1.753 m)    Physical Exam Vitals and nursing note reviewed.  Constitutional:      Appearance: Normal appearance.  Cardiovascular:     Rate and Rhythm: Normal rate and regular rhythm.     Pulses: Normal pulses.     Heart sounds: Normal heart sounds.  Pulmonary:     Effort: Pulmonary effort is normal.     Breath sounds: Normal breath sounds.  Genitourinary:    General: Normal vulva.     Exam position: Lithotomy position.     Pubic Area: No rash.      Labia:        Right: No tenderness or lesion.        Left: No rash, tenderness or lesion.      Vagina: No vaginal discharge, erythema, tenderness or bleeding.  Cervix: No cervical motion tenderness, discharge or friability. Lesions: hyperpigmented scarring at os S/P Leep.    Uterus: Normal.      Adnexa: Right adnexa normal and left adnexa normal.     Rectum: Normal.  Lymphadenopathy:     Lower Body: No right inguinal adenopathy. No left inguinal adenopathy.  Skin:    General: Skin is warm and dry.  Neurological:     Mental Status: She is alert and oriented to person, place, and time.    Assessment and Plan:  Evelyn Stevenson is a 37 y.o. female G2P2002 presenting to the [facility] Department for an yearly wellness and contraception visit   Contraception counseling: Reviewed options based on  patient desire and reproductive life plan. Patient is interested in Hormonal Injection. This was provided to the patient today.   Risks, benefits, and typical effectiveness rates were reviewed.  Questions were answered.  Written information was also given to the patient to review.    The patient will follow up in  3 months for surveillance.  The patient was told to call with any further questions, or with any concerns about this method of contraception.  Emphasized use of condoms 100% of the time for STI prevention.  Patient was assessed for need for ECP, not indicated.   1. Well woman exam Brief Physical Examination performed, CBE due in 2025 Pap smear collected today, one year S/P Leep procedure Needs HgbA1C screening at next visit for Depo injection, declined bloodwork today.  - IGP, Aptima HPV  2. Surveillance for Depo-Provera contraception Depo Provera '150mg'$  IM q11-13 weeks, #1, 4 refills Use condoms for next 7 days as backup contraception  Discussed healthy response to increased appetite with Depo   Return in about 3 months (around 04/24/2023) for Depo injection.  No future appointments.  Marline Backbone, FNP

## 2023-01-22 NOTE — Progress Notes (Signed)
Pt here for physical and late Depo Provera injection.  No inhouse labs performed.  Depo Provera '150mg'$  IM given in L deltoid without complications.  Pt counseled to use condoms as back up x 7days.  Pt verbalizes understanding.  Condoms declined.-Forest Becker, RN

## 2023-02-02 LAB — IGP, APTIMA HPV
HPV Aptima: POSITIVE — AB
PAP Smear Comment: 0

## 2023-02-07 ENCOUNTER — Telehealth: Payer: Self-pay

## 2023-02-07 NOTE — Telephone Encounter (Signed)
-----   Message from Eduard Clos, [place] sent at 02/07/2023  4:02 PM EDT -----  ----- Message ----- From: Marline Backbone, FNP Sent: 02/07/2023   4:00 PM EDT To: Eduard Clos, RN  ASCUS/HPV POSITIVE with history of Colposcopy result of CIN2-3 on 11/10/2021 and LEEP procedure on 01/17/2022. Needs referral back to [facility]. Thanks, [employer], FNP-C

## 2023-02-07 NOTE — Telephone Encounter (Addendum)
Call pt re pap results from 01/22/23 specimen and colpo referral. Referred to [facility] ([facility]  Mailed pap card 02/08/23.

## 2023-02-12 NOTE — Telephone Encounter (Signed)
Phone call to patient, states she did receive pap card in the mail.  Discussed pap smear and colpo referral. Answered questions. Pt expecting call from [facility].

## 2023-02-12 NOTE — Telephone Encounter (Signed)
02/12/23 colpo referral sent to [facility] through Epic Referrals tab.

## 2023-02-26 ENCOUNTER — Ambulatory Visit: Payer: Medicaid Other | Admitting: Nurse Practitioner

## 2023-02-26 ENCOUNTER — Encounter: Payer: Self-pay | Admitting: Nurse Practitioner

## 2023-02-26 VITALS — BP 134/72 | HR 93 | Ht 69.0 in | Wt 288.8 lb

## 2023-02-26 DIAGNOSIS — R7301 Impaired fasting glucose: Secondary | ICD-10-CM

## 2023-02-26 DIAGNOSIS — R5383 Other fatigue: Secondary | ICD-10-CM

## 2023-02-26 DIAGNOSIS — E663 Overweight: Secondary | ICD-10-CM

## 2023-02-26 NOTE — Progress Notes (Signed)
New Patient Office Visit  Subjective    Patient ID: Evelyn Stevenson, female    DOB: 08/05/1986  Age: 37 y.o. MRN: [id number]  CC: No chief complaint on file.   HPI Evelyn Stevenson presents to establish care New patient visit today.  Outpatient Encounter Medications as of 02/26/2023  Medication Sig   ibuprofen (ADVIL,MOTRIN) 600 MG tablet Take 1 tablet (600 mg total) by mouth every 6 (six) hours as needed for mild pain, moderate pain or cramping. (Patient not taking: Reported on 01/12/2022)   Facility-Administered Encounter Medications as of 02/26/2023  Medication   medroxyPROGESTERone (DEPO-PROVERA) injection 150 mg    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   Asthma    as a child   Heart murmur    PONV (postoperative nausea and vomiting)     Past Surgical History:  Procedure Laterality Date   CESAREAN SECTION  2007   LEEP  2005   LEEP N/A 01/17/2022   Procedure: LOOP ELECTROSURGICAL EXCISION PROCEDURE (LEEP);  Surgeon: Natale Milch, MD;  Location: [facility] ORS;  Service: Gynecology;  Laterality: N/A;    Family History  Problem Relation Age of Onset   Hypertension Mother    Heart failure Mother    Hypertension Father    Diabetes Father    Cancer Maternal Grandmother    Arthritis Maternal Grandmother    Diabetes Maternal Grandmother    Hypertension Maternal Grandmother    COPD Maternal Grandmother    Prostate cancer Maternal Grandfather    Cirrhosis Maternal Grandfather    Hypertension Maternal Grandfather    Hypertension Paternal Grandmother    Hypertension Paternal Grandfather    Asthma Daughter    Breast cancer Neg Hx     Social History   Socioeconomic History   Marital status: Single    Spouse name: Not on file   Number of children: 2   Years of education: Not on file   Highest education level: 12th grade  Occupational History   Occupation: Administrator, sports  Tobacco Use   Smoking  status: Every Day    Packs/day: .5    Types: Cigarettes   Smokeless tobacco: Never   Tobacco comments:    smokes every other day  Vaping Use   Vaping Use: Never used  Substance and Sexual Activity   Alcohol use: Yes    Alcohol/week: 4.0 standard drinks of alcohol    Types: 4 Shots of liquor per week    Comment: occassion   Drug use: Not Currently    Types: Marijuana    Comment: last used beginning of year   Sexual activity: Not Currently    Partners: Male    Birth control/protection: Condom, Injection  Other Topics Concern   Not on file  Social History Narrative   Not on file   Social Determinants of Health   Financial Resource Strain: Not on file  Food Insecurity: Not on file  Transportation Needs: Not on file  Physical Activity: Not on file  Stress: Not on file  Social Connections: Not on file  Intimate Partner Violence: Not At Risk (01/22/2023)   Humiliation, Afraid, Rape, and Kick questionnaire    Fear of Current or Ex-Partner: No    Emotionally Abused: No    Physically Abused: No    Sexually Abused: No    Review of Systems  Constitutional: Negative.   HENT: Negative.    Eyes: Negative.   Respiratory:  Positive for cough.   Cardiovascular: Negative.   Gastrointestinal: Negative.   Genitourinary: Negative.   Musculoskeletal: Negative.   Skin: Negative.   Neurological: Negative.   Endo/Heme/Allergies: Negative.   Psychiatric/Behavioral: Negative.          Objective    BP 134/72   Pulse 93   Ht 5\' 9"  (1.753 m)   Wt 288 lb 12.8 oz (131 kg)   SpO2 96%   BMI 42.65 kg/m   Physical Exam Vitals reviewed.  Constitutional:      Appearance: Normal appearance.  HENT:     Head: Normocephalic.     Nose: Nose normal.     Mouth/Throat:     Mouth: Mucous membranes are moist.  Eyes:     Pupils: Pupils are equal, round, and reactive to light.  Cardiovascular:     Rate and Rhythm: Normal rate and regular rhythm.  Pulmonary:     Effort: Pulmonary effort  is normal.     Breath sounds: Normal breath sounds.  Abdominal:     General: Bowel sounds are normal.     Palpations: Abdomen is soft.  Musculoskeletal:        General: Normal range of motion.     Cervical back: Normal range of motion and neck supple.  Skin:    General: Skin is warm and dry.  Neurological:     Mental Status: She is alert and oriented to person, place, and time.  Psychiatric:        Mood and Affect: Mood normal.        Behavior: Behavior normal.         Assessment & Plan:   Problem List Items Addressed This Visit       Other   Other fatigue - Primary   Relevant Orders   TSH   Overweight   Relevant Orders   CMP14+EGFR   Lipid panel   Other Visit Diagnoses     Impaired fasting blood sugar       Relevant Orders   Hemoglobin A1c       Return in about 1 month (around 03/28/2023).   Orson Eva, NP

## 2023-02-26 NOTE — Patient Instructions (Signed)
1) Fasting labs today 2) Follow up appt in 1 month to start phentermine

## 2023-02-27 LAB — CMP14+EGFR
ALT: 10 IU/L (ref 0–32)
AST: 11 IU/L (ref 0–40)
Albumin/Globulin Ratio: 1.3 (ref 1.2–2.2)
Albumin: 3.8 g/dL — ABNORMAL LOW (ref 3.9–4.9)
Alkaline Phosphatase: 86 IU/L (ref 44–121)
BUN/Creatinine Ratio: 15 (ref 9–23)
BUN: 14 mg/dL (ref 6–20)
Bilirubin Total: 0.4 mg/dL (ref 0.0–1.2)
CO2: 20 mmol/L (ref 20–29)
Calcium: 9 mg/dL (ref 8.7–10.2)
Chloride: 105 mmol/L (ref 96–106)
Creatinine, Ser: 0.93 mg/dL (ref 0.57–1.00)
Globulin, Total: 2.9 g/dL (ref 1.5–4.5)
Glucose: 92 mg/dL (ref 70–99)
Potassium: 4.2 mmol/L (ref 3.5–5.2)
Sodium: 139 mmol/L (ref 134–144)
Total Protein: 6.7 g/dL (ref 6.0–8.5)
eGFR: 81 mL/min/{1.73_m2} (ref >59)

## 2023-02-27 LAB — LIPID PANEL
Chol/HDL Ratio: 3.7 ratio (ref 0.0–4.4)
Cholesterol, Total: 155 mg/dL (ref 100–199)
HDL: 42 mg/dL (ref >39)
LDL Chol Calc (NIH): 95 mg/dL (ref 0–99)
Triglycerides: 94 mg/dL (ref 0–149)
VLDL Cholesterol Cal: 18 mg/dL (ref 5–40)

## 2023-02-27 LAB — HEMOGLOBIN A1C
Est. average glucose Bld gHb Est-mCnc: 108 mg/dL
Hgb A1c MFr Bld: 5.4 % (ref 4.8–5.6)

## 2023-02-27 LAB — TSH: TSH: 2.33 u[IU]/mL (ref 0.450–4.500)

## 2023-03-16 ENCOUNTER — Encounter: Payer: Self-pay | Admitting: Obstetrics and Gynecology

## 2023-03-16 ENCOUNTER — Other Ambulatory Visit (HOSPITAL_COMMUNITY)
Admission: RE | Admit: 2023-03-16 | Discharge: 2023-03-16 | Disposition: A | Discharge status: Home / Self Care | Payer: Medicaid Other | Source: Ambulatory Visit | Attending: Obstetrics and Gynecology | Admitting: Obstetrics and Gynecology

## 2023-03-16 ENCOUNTER — Ambulatory Visit (INDEPENDENT_AMBULATORY_CARE_PROVIDER_SITE_OTHER): Payer: Medicaid Other | Admitting: Obstetrics and Gynecology

## 2023-03-16 VITALS — BP 97/61 | HR 100 | Resp 16 | Ht 69.0 in | Wt 294.4 lb

## 2023-03-16 DIAGNOSIS — N870 Mild cervical dysplasia: Secondary | ICD-10-CM | POA: Diagnosis not present

## 2023-03-16 DIAGNOSIS — R87610 Atypical squamous cells of undetermined significance on cytologic smear of cervix (ASC-US): Secondary | ICD-10-CM

## 2023-03-16 DIAGNOSIS — Z124 Encounter for screening for malignant neoplasm of cervix: Secondary | ICD-10-CM | POA: Insufficient documentation

## 2023-03-16 DIAGNOSIS — N871 Moderate cervical dysplasia: Secondary | ICD-10-CM

## 2023-03-16 NOTE — Progress Notes (Signed)
    GYNECOLOGY OFFICE COLPOSCOPY PROCEDURE NOTE  37 y.o. [id number] here for colposcopy for ASCUS with POSITIVE high risk HPV pap smear on 01/22/2023. Discussed role for HPV in cervical dysplasia, need for surveillance.  Has history of LEEP performed 01/17/2022 for CIN II-III.  Reports that was er second LEEP.   Patient gave informed written consent, time out was performed.  Placed in lithotomy position. Cervix viewed with speculum and colposcope after application of acetic acid.   Colposcopy adequate? Yes  no mosaicism, no punctation, no abnormal vasculature, and HPV changes noted at 1 o'clock; corresponding biopsies obtained.  ECC specimen obtained. All specimens were labeled and sent to pathology.  Chaperone was present during entire procedure.  Patient was given post procedure instructions.  Will follow up pathology and manage accordingly; patient will be contacted with results and recommendations.  Routine preventative health maintenance measures emphasized.    Hildred Laser, MD [facility]

## 2023-03-19 ENCOUNTER — Encounter: Payer: Self-pay | Admitting: Obstetrics and Gynecology

## 2023-03-20 LAB — SURGICAL PATHOLOGY

## 2023-03-26 ENCOUNTER — Ambulatory Visit: Payer: Medicaid Other | Admitting: Nurse Practitioner

## 2023-03-26 ENCOUNTER — Encounter: Payer: Self-pay | Admitting: Nurse Practitioner

## 2023-03-26 VITALS — BP 142/88 | HR 85 | Ht 69.0 in | Wt 290.6 lb

## 2023-03-26 DIAGNOSIS — R7301 Impaired fasting glucose: Secondary | ICD-10-CM

## 2023-03-26 DIAGNOSIS — J45909 Unspecified asthma, uncomplicated: Secondary | ICD-10-CM

## 2023-03-26 DIAGNOSIS — E669 Obesity, unspecified: Secondary | ICD-10-CM | POA: Diagnosis not present

## 2023-03-26 DIAGNOSIS — Z6838 Body mass index (BMI) 38.0-38.9, adult: Secondary | ICD-10-CM

## 2023-03-26 DIAGNOSIS — E663 Overweight: Secondary | ICD-10-CM | POA: Diagnosis not present

## 2023-03-26 DIAGNOSIS — E66812 Obesity, class 2: Secondary | ICD-10-CM

## 2023-03-26 DIAGNOSIS — R5383 Other fatigue: Secondary | ICD-10-CM | POA: Diagnosis not present

## 2023-03-26 MED ORDER — PHENTERMINE HCL 37.5 MG PO TABS: 37.5000 mg | ORAL_TABLET | Freq: Every day | ORAL | 2 refills | Status: DC

## 2023-03-26 NOTE — Patient Instructions (Signed)
1) Phentermine 2) Next appt after 05/29/23, fasting labs prior

## 2023-03-26 NOTE — Progress Notes (Signed)
Established Patient Office Visit  Subjective:  Patient ID: Evelyn Stevenson, female    DOB: [birth year]  Age: 37 y.o. MRN: [id number]  Chief Complaint  Patient presents with   Follow-up    1 month follow up    1 month follow up, patient ready to start phentermine.  Patient has been advised of the side effects.  Patent is working on [employer] and will repeat labs at next appt in mid July.    No other concerns at this time.   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   Asthma    as a child   Heart murmur    PONV (postoperative nausea and vomiting)     Past Surgical History:  Procedure Laterality Date   CESAREAN SECTION  2007   LEEP  2005   LEEP N/A 01/17/2022   Procedure: LOOP ELECTROSURGICAL EXCISION PROCEDURE (LEEP);  Surgeon: Natale Milch, MD;  Location: [facility] ORS;  Service: Gynecology;  Laterality: N/A;    Social History   Socioeconomic History   Marital status: Single    Spouse name: Not on file   Number of children: 2   Years of education: Not on file   Highest education level: 12th grade  Occupational History   Occupation: Administrator, sports  Tobacco Use   Smoking status: Every Day    Packs/day: .5    Types: Cigarettes   Smokeless tobacco: Never   Tobacco comments:    smokes every other day  Vaping Use   Vaping Use: Never used  Substance and Sexual Activity   Alcohol use: Yes    Alcohol/week: 4.0 standard drinks of alcohol    Types: 4 Shots of liquor per week    Comment: occassion   Drug use: Not Currently    Types: Marijuana    Comment: last used beginning of year   Sexual activity: Not Currently    Partners: Male    Birth control/protection: Condom, Injection  Other Topics Concern   Not on file  Social History Narrative   Not on file   Social Determinants of Health   Financial Resource Strain: Not on file  Food Insecurity: Not on file  Transportation Needs: Not on file   Physical Activity: Not on file  Stress: Not on file  Social Connections: Not on file  Intimate Partner Violence: Not At Risk (01/22/2023)   Humiliation, Afraid, Rape, and Kick questionnaire    Fear of Current or Ex-Partner: No    Emotionally Abused: No    Physically Abused: No    Sexually Abused: No    Family History  Problem Relation Age of Onset   Hypertension Mother    Heart failure Mother    Hypertension Father    Diabetes Father    Cancer Maternal Grandmother    Arthritis Maternal Grandmother    Diabetes Maternal Grandmother    Hypertension Maternal Grandmother    COPD Maternal Grandmother    Prostate cancer Maternal Grandfather    Cirrhosis Maternal Grandfather    Hypertension Maternal Grandfather    Hypertension Paternal Grandmother    Hypertension Paternal Grandfather    Asthma Daughter    Breast cancer Neg Hx     No Known Allergies  Review of Systems  Constitutional: Negative.   HENT: Negative.    Eyes: Negative.   Respiratory: Negative.    Cardiovascular: Negative.   Gastrointestinal: Negative.   Genitourinary: Negative.  Musculoskeletal: Negative.   Skin: Negative.   Neurological: Negative.   Endo/Heme/Allergies: Negative.   Psychiatric/Behavioral: Negative.         Objective:   BP (!) 142/88   Pulse 85   Ht 5\' 9"  (1.753 m)   Wt 290 lb 9.6 oz (131.8 kg)   LMP  (LMP Unknown)   SpO2 97%   BMI 42.91 kg/m   Vitals:   03/26/23 0932  BP: (!) 142/88  Pulse: 85  Height: 5\' 9"  (1.753 m)  Weight: 290 lb 9.6 oz (131.8 kg)  SpO2: 97%  BMI (Calculated): 42.89    Physical Exam Vitals reviewed.  Constitutional:      Appearance: Normal appearance.  HENT:     Head: Normocephalic.     Nose: Nose normal.     Mouth/Throat:     Mouth: Mucous membranes are moist.  Eyes:     Pupils: Pupils are equal, round, and reactive to light.  Cardiovascular:     Rate and Rhythm: Normal rate and regular rhythm.  Pulmonary:     Effort: Pulmonary effort is  normal.     Breath sounds: Normal breath sounds.  Abdominal:     General: Bowel sounds are normal.     Palpations: Abdomen is soft.  Musculoskeletal:        General: Normal range of motion.     Cervical back: Normal range of motion and neck supple.  Skin:    General: Skin is warm and dry.  Neurological:     Mental Status: She is alert and oriented to person, place, and time.  Psychiatric:        Mood and Affect: Mood normal.        Behavior: Behavior normal.      No results found for any visits on 03/26/23.  Recent Results (from the past 2160 hour(s))  IGP, Aptima HPV     Status: Abnormal   Collection Time: 01/22/23  2:03 PM  Result Value Ref Range   DIAGNOSIS: Comment (A)     Comment: EPITHELIAL CELL ABNORMALITY. ATYPICAL SQUAMOUS CELLS OF UNDETERMINED SIGNIFICANCE (ASC-US).    Specimen adequacy: Comment     Comment: Satisfactory for evaluation. Endocervical and/or squamous metaplastic cells (endocervical component) are present.    Clinician Provided ICD10 Comment     Comment: Z01.419   Performed by: Comment     Comment: Clarnce Flock, Cytotechnologist (ASCP)   Electronically signed by: Comment     Comment: Romie Jumper, MD, Pathologist   PAP Smear Comment .    PATHOLOGIST PROVIDED ICD10: Comment     Comment: R87.610   Note: Comment     Comment: The Pap smear is a screening test designed to aid in the detection of premalignant and malignant conditions of the uterine cervix.  It is not a diagnostic procedure and should not be used as the sole means of detecting cervical cancer.  Both false-positive and false-negative reports do occur.    Test Methodology Comment     Comment: This liquid based ThinPrep(R) pap test was screened with the use of an image guided system.    HPV Aptima Positive (A) Negative    Comment: This nucleic acid amplification test detects fourteen high-risk HPV types (16,18,31,33,35,39,45,51,52,56,58,59,66,68) without differentiation.    Hemoglobin A1c     Status: None   Collection Time: 02/26/23 10:27 AM  Result Value Ref Range   Hgb A1c MFr Bld 5.4 4.8 - 5.6 %    Comment:  Prediabetes: 5.7 - 6.4          Diabetes: >6.4          Glycemic control for adults with diabetes: <7.0    Est. average glucose Bld gHb Est-mCnc 108 mg/dL  TSH     Status: None   Collection Time: 02/26/23 10:27 AM  Result Value Ref Range   TSH 2.330 0.450 - 4.500 uIU/mL  CMP14+EGFR     Status: Abnormal   Collection Time: 02/26/23 10:27 AM  Result Value Ref Range   Glucose 92 70 - 99 mg/dL   BUN 14 6 - 20 mg/dL   Creatinine, Ser 5.95 0.57 - 1.00 mg/dL   eGFR 81 >63 OV/FIE/3.32   BUN/Creatinine Ratio 15 9 - 23   Sodium 139 134 - 144 mmol/L   Potassium 4.2 3.5 - 5.2 mmol/L   Chloride 105 96 - 106 mmol/L   CO2 20 20 - 29 mmol/L   Calcium 9.0 8.7 - 10.2 mg/dL   Total Protein 6.7 6.0 - 8.5 g/dL   Albumin 3.8 (L) 3.9 - 4.9 g/dL   Globulin, Total 2.9 1.5 - 4.5 g/dL   Albumin/Globulin Ratio 1.3 1.2 - 2.2   Bilirubin Total 0.4 0.0 - 1.2 mg/dL   Alkaline Phosphatase 86 44 - 121 IU/L   AST 11 0 - 40 IU/L   ALT 10 0 - 32 IU/L  Lipid panel     Status: None   Collection Time: 02/26/23 10:27 AM  Result Value Ref Range   Cholesterol, Total 155 100 - 199 mg/dL   Triglycerides 94 0 - 149 mg/dL   HDL 42 >95 mg/dL   VLDL Cholesterol Cal 18 5 - 40 mg/dL   LDL Chol Calc (NIH) 95 0 - 99 mg/dL   Chol/HDL Ratio 3.7 0.0 - 4.4 ratio    Comment:                                   T. Chol/HDL Ratio                                             Men  Women                               1/2 Avg.Risk  3.4    3.3                                   Avg.Risk  5.0    4.4                                2X Avg.Risk  9.6    7.1                                3X Avg.Risk 23.4   11.0   Surgical pathology     Status: None   Collection Time: 03/16/23  3:48 PM  Result Value Ref Range   SURGICAL PATHOLOGY      SURGICAL PATHOLOGY CASE: [id number] PATIENT:  [facility] Surgical Pathology Report  Clinical History: ASCUS, HPV positive, CIN 2 (cm)     FINAL MICROSCOPIC DIAGNOSIS:  A. CERVIX, 1 O'CLOCK, BIOPSY: Low-grade squamous intraepithelial lesion, CIN-1.  B. ENDOCERVIX, CURETTAGE: Mucus and scanty benign endocervical cells.    GROSS DESCRIPTION:  A: Received in formalin is a tan, soft tissue fragment that is submitted in toto.  Size: 0.3 cm, 1 block submitted.  B: Received in formalin are 0.8 x 0.7 x 0.3 cm of cloudy mucus.  The specimen is submitted in toto.  [facility] 03/19/2023)   Final Diagnosis performed by Jimmy Picket, MD.   Electronically signed 03/20/2023 Technical and / or Professional components performed at [facility]. [facility], [address], [place], [place] [address].  Immunohistochemistry Technical component (if applicable) was performed at [name]. [address], [address], [place], [place] [address].   I MMUNOHISTOCHEMISTRY DISCLAIMER (if applicable): Some of these immunohistochemical stains may have been developed and the performance characteristics determine by [facility]. Some may not have been cleared or approved by the U.S. Food and Drug Administration. The FDA has determined that such clearance or approval is not necessary. This test is used for clinical purposes. It should not be regarded as investigational or for research. This laboratory is certified under the Clinical Laboratory Improvement Amendments of 1988 (CLIA-88) as qualified to perform high complexity clinical laboratory testing.  The controls stained appropriately.       Assessment & Plan:   Problem List Items Addressed This Visit   None   No follow-ups on file.   Total time spent: 35 minutes  Orson Eva, NP  03/26/2023

## 2023-03-30 NOTE — Progress Notes (Signed)
Colpo completed by Dr. Hildred Laser with [facility] on 03/16/23.  Copied from Dr. Oretha Milch follow-up with pt on 03/16/23 Surgical pathology result:  Evelyn Stevenson,   Your biopsy results show that you have low grade cell changes. These can be monitored over time. This does not require any further intervention at this time. You will need a repeat pap smear in 1 year ...  Written by Hildred Laser, MD on 03/20/2023  4:07 PM EDT View Full Comments Seen by patient [name] on 03/21/2023  7:19 AM

## 2023-05-03 ENCOUNTER — Ambulatory Visit (LOCAL_COMMUNITY_HEALTH_CENTER): Payer: Medicaid Other

## 2023-05-03 VITALS — BP 120/81 | Ht 69.0 in | Wt 293.0 lb

## 2023-05-03 DIAGNOSIS — Z30013 Encounter for initial prescription of injectable contraceptive: Secondary | ICD-10-CM

## 2023-05-03 DIAGNOSIS — Z309 Encounter for contraceptive management, unspecified: Secondary | ICD-10-CM | POA: Diagnosis not present

## 2023-05-03 DIAGNOSIS — Z3042 Encounter for surveillance of injectable contraceptive: Secondary | ICD-10-CM

## 2023-05-03 DIAGNOSIS — Z3009 Encounter for other general counseling and advice on contraception: Secondary | ICD-10-CM

## 2023-05-03 NOTE — Progress Notes (Signed)
14 weeks 3 days post depo. Voices no concerns. Advised to adhere to 11 to 13 week intervals between depo injections for optimal benefit. Pt in agreement.  Per annual 01/22/2023, K House, FNP recommended Hgb A1C for this depo visit. Patient declines today and plans to have f-u with PCP at [facility].  Depo given today per order by Aron Baba, FNP dated 01/22/2023. Tolerated well R delt. Next depo due 07/19/2023, has reminder. Jerel Shepherd, RN

## 2023-06-01 ENCOUNTER — Ambulatory Visit: Payer: Medicaid Other | Admitting: Cardiology

## 2023-07-12 ENCOUNTER — Encounter: Payer: Self-pay | Admitting: Cardiology

## 2023-07-12 ENCOUNTER — Ambulatory Visit
Admission: RE | Admit: 2023-07-12 | Discharge: 2023-07-12 | Disposition: A | Discharge status: Home / Self Care | Payer: Medicaid Other | Source: Ambulatory Visit | Attending: Cardiology | Admitting: Cardiology

## 2023-07-12 ENCOUNTER — Other Ambulatory Visit: Payer: Self-pay | Admitting: Cardiology

## 2023-07-12 ENCOUNTER — Ambulatory Visit
Admission: RE | Admit: 2023-07-12 | Discharge: 2023-07-12 | Disposition: A | Discharge status: Home / Self Care | Payer: Medicaid Other | Source: Home / Self Care | Attending: Cardiology | Admitting: Cardiology

## 2023-07-12 ENCOUNTER — Ambulatory Visit: Payer: Medicaid Other | Admitting: Cardiology

## 2023-07-12 VITALS — BP 130/75 | HR 90 | Ht 69.0 in | Wt 291.4 lb

## 2023-07-12 DIAGNOSIS — R5383 Other fatigue: Secondary | ICD-10-CM

## 2023-07-12 DIAGNOSIS — R0602 Shortness of breath: Secondary | ICD-10-CM | POA: Diagnosis present

## 2023-07-12 DIAGNOSIS — J45909 Unspecified asthma, uncomplicated: Secondary | ICD-10-CM

## 2023-07-12 DIAGNOSIS — Z6841 Body Mass Index (BMI) 40.0 and over, adult: Secondary | ICD-10-CM

## 2023-07-12 MED ORDER — SAXENDA 18 MG/3ML ~~LOC~~ SOPN: 0.6000 mg | PEN_INJECTOR | Freq: Every day | SUBCUTANEOUS | 3 refills | Status: DC

## 2023-07-12 NOTE — Progress Notes (Signed)
Spoke with pt who verbalized understanding.

## 2023-07-12 NOTE — Progress Notes (Signed)
Established Patient Office Visit  Subjective:  Patient ID: Evelyn Stevenson, female    DOB: [birth year]  Age: 37 y.o. MRN: [id number]  Chief Complaint  Patient presents with   Follow-up    F/U    Patient in office for 3 month follow up. No recent lab work. Had Covid 2-3 weeks ago. States she continues to get short of breath. Has a history of asthma. Will order a chest xray. If normal, will refer to pulmonary.  Patient has not started phentermine due to reluctance to take a controlled substance. Patient Hgb A1c 02/2023 normal. Will send in Saxenda pending insurance approval. Return 6 weeks after starting.    No other concerns at this time.   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   Asthma    as a child   Heart murmur    PONV (postoperative nausea and vomiting)     Past Surgical History:  Procedure Laterality Date   CESAREAN SECTION  2007   LEEP  2005   LEEP N/A 01/17/2022   Procedure: LOOP ELECTROSURGICAL EXCISION PROCEDURE (LEEP);  Surgeon: Natale Milch, MD;  Location: [facility] ORS;  Service: Gynecology;  Laterality: N/A;    Social History   Socioeconomic History   Marital status: Single    Spouse name: Not on file   Number of children: 2   Years of education: Not on file   Highest education level: 12th grade  Occupational History   Occupation: Administrator, sports  Tobacco Use   Smoking status: Every Day    Current packs/day: 0.50    Types: Cigarettes   Smokeless tobacco: Never   Tobacco comments:    smokes every other day  Vaping Use   Vaping status: Never Used  Substance and Sexual Activity   Alcohol use: Yes    Alcohol/week: 4.0 standard drinks of alcohol    Types: 4 Shots of liquor per week    Comment: occassion   Drug use: Not Currently    Types: Marijuana    Comment: last used beginning of year   Sexual activity: Not Currently    Partners: Male    Birth control/protection: Condom,  Injection  Other Topics Concern   Not on file  Social History Narrative   Not on file   Social Determinants of Health   Financial Resource Strain: Not on file  Food Insecurity: Not on file  Transportation Needs: Not on file  Physical Activity: Not on file  Stress: Not on file  Social Connections: Not on file  Intimate Partner Violence: Not At Risk (01/22/2023)   Humiliation, Afraid, Rape, and Kick questionnaire    Fear of Current or Ex-Partner: No    Emotionally Abused: No    Physically Abused: No    Sexually Abused: No    Family History  Problem Relation Age of Onset   Hypertension Mother    Heart failure Mother    Hypertension Father    Diabetes Father    Cancer Maternal Grandmother    Arthritis Maternal Grandmother    Diabetes Maternal Grandmother    Hypertension Maternal Grandmother    COPD Maternal Grandmother    Prostate cancer Maternal Grandfather    Cirrhosis Maternal Grandfather    Hypertension Maternal Grandfather    Hypertension Paternal Grandmother    Hypertension Paternal Grandfather    Asthma Daughter    Breast cancer Neg Hx     No Known Allergies  Review of Systems  Constitutional:  Positive for malaise/fatigue.  HENT: Negative.    Eyes: Negative.   Respiratory:  Positive for shortness of breath.   Cardiovascular: Negative.  Negative for chest pain.  Gastrointestinal: Negative.  Negative for abdominal pain, constipation and diarrhea.  Genitourinary: Negative.   Musculoskeletal:  Positive for joint pain. Negative for myalgias.  Skin: Negative.   Neurological: Negative.  Negative for dizziness and headaches.  Endo/Heme/Allergies: Negative.   All other systems reviewed and are negative.      Objective:   BP 130/75   Pulse 90   Ht 5\' 9"  (1.753 m)   Wt 291 lb 6.4 oz (132.2 kg)   SpO2 98%   BMI 43.03 kg/m   Vitals:   07/12/23 1042  BP: 130/75  Pulse: 90  Height: 5\' 9"  (1.753 m)  Weight: 291 lb 6.4 oz (132.2 kg)  SpO2: 98%  BMI  (Calculated): 43.01    Physical Exam Vitals and nursing note reviewed.  Constitutional:      Appearance: Normal appearance. She is normal weight.  HENT:     Head: Normocephalic and atraumatic.     Nose: Nose normal.     Mouth/Throat:     Mouth: Mucous membranes are moist.  Eyes:     Extraocular Movements: Extraocular movements intact.     Conjunctiva/sclera: Conjunctivae normal.     Pupils: Pupils are equal, round, and reactive to light.  Cardiovascular:     Rate and Rhythm: Normal rate and regular rhythm.     Pulses: Normal pulses.     Heart sounds: Normal heart sounds.  Pulmonary:     Effort: Pulmonary effort is normal.     Breath sounds: Normal breath sounds.  Abdominal:     General: Abdomen is flat. Bowel sounds are normal.     Palpations: Abdomen is soft.  Musculoskeletal:        General: Normal range of motion.     Cervical back: Normal range of motion.  Skin:    General: Skin is warm and dry.  Neurological:     General: No focal deficit present.     Mental Status: She is alert and oriented to person, place, and time.  Psychiatric:        Mood and Affect: Mood normal.        Behavior: Behavior normal.        Thought Content: Thought content normal.        Judgment: Judgment normal.      No results found for any visits on 07/12/23.  No results found for this or any previous visit (from the past 2160 hour(s)).    Assessment & Plan:   Problem List Items Addressed This Visit       Other   Obesity,  BMI=43.1   Relevant Medications   Liraglutide -Weight Management (SAXENDA) 18 MG/3ML SOPN   Other fatigue   Shortness of breath - Primary   Relevant Orders   DG Chest 2 View    Return in about 6 weeks (around 08/23/2023).   Total time spent: 25 minutes  [employer], NP  07/12/2023   This document may have been prepared by Dragon Voice Recognition software and as such may include unintentional dictation errors.

## 2023-07-20 ENCOUNTER — Ambulatory Visit (LOCAL_COMMUNITY_HEALTH_CENTER): Payer: Medicaid Other

## 2023-07-20 ENCOUNTER — Ambulatory Visit: Payer: Medicaid Other

## 2023-07-20 VITALS — BP 116/68 | Ht 69.0 in | Wt 290.0 lb

## 2023-07-20 DIAGNOSIS — Z309 Encounter for contraceptive management, unspecified: Secondary | ICD-10-CM

## 2023-07-20 DIAGNOSIS — Z3042 Encounter for surveillance of injectable contraceptive: Secondary | ICD-10-CM

## 2023-07-20 DIAGNOSIS — Z30013 Encounter for initial prescription of injectable contraceptive: Secondary | ICD-10-CM

## 2023-07-20 DIAGNOSIS — Z3009 Encounter for other general counseling and advice on contraception: Secondary | ICD-10-CM

## 2023-07-20 NOTE — Progress Notes (Signed)
11w 1d post depo. Voices no concerns. Depo given today per order by K. House, FNP dated 01/22/2023. Tolerated well in L deltoid. Next depo due 10/05/2023; patient has reminder card.   Abagail Kitchens, RN

## 2023-08-01 ENCOUNTER — Telehealth: Payer: Self-pay

## 2023-08-01 NOTE — Telephone Encounter (Signed)
Patient informed that once PA is completed that she will be able to pick up Rx at the pharmacy.

## 2023-08-23 ENCOUNTER — Ambulatory Visit: Payer: Medicaid Other | Admitting: Cardiology

## 2023-10-23 ENCOUNTER — Ambulatory Visit: Payer: Medicaid Other

## 2023-10-23 VITALS — BP 121/65 | Ht 69.0 in | Wt 299.5 lb

## 2023-10-23 DIAGNOSIS — Z3042 Encounter for surveillance of injectable contraceptive: Secondary | ICD-10-CM

## 2023-10-23 DIAGNOSIS — Z309 Encounter for contraceptive management, unspecified: Secondary | ICD-10-CM

## 2023-10-23 DIAGNOSIS — Z30013 Encounter for initial prescription of injectable contraceptive: Secondary | ICD-10-CM

## 2023-10-23 DIAGNOSIS — Z3009 Encounter for other general counseling and advice on contraception: Secondary | ICD-10-CM

## 2023-10-23 NOTE — Progress Notes (Signed)
13 Weeks   4 Days since last Depo   Voices no concerns today.  Counseled to adhere to 11 to 13 week intervals between depo injections for optimal benefit.  Depo given today per order by Aron Baba, FNP  dated 01/22/2023.  Tolerated well Right deltoid.  Next depo due 01/08/2024 and annual PE due 01/23/2024 has reminder card.  Jerel Shepherd, RN

## 2024-01-28 ENCOUNTER — Ambulatory Visit (LOCAL_COMMUNITY_HEALTH_CENTER): Admitting: Nurse Practitioner

## 2024-01-28 ENCOUNTER — Encounter: Payer: Self-pay | Admitting: Nurse Practitioner

## 2024-01-28 VITALS — BP 131/81 | HR 83 | Wt 295.8 lb

## 2024-01-28 DIAGNOSIS — Z30013 Encounter for initial prescription of injectable contraceptive: Secondary | ICD-10-CM | POA: Diagnosis not present

## 2024-01-28 DIAGNOSIS — Z309 Encounter for contraceptive management, unspecified: Secondary | ICD-10-CM

## 2024-01-28 DIAGNOSIS — Z3009 Encounter for other general counseling and advice on contraception: Secondary | ICD-10-CM

## 2024-01-28 DIAGNOSIS — Z113 Encounter for screening for infections with a predominantly sexual mode of transmission: Secondary | ICD-10-CM

## 2024-01-28 DIAGNOSIS — Z01419 Encounter for gynecological examination (general) (routine) without abnormal findings: Secondary | ICD-10-CM

## 2024-01-28 NOTE — Progress Notes (Signed)
 Patient is here for PE and Depo injection. Injection given today at the [facility] and pt tolerated well to injection. Reminder card given and condoms declined. Sonda Primes, RN.

## 2024-02-04 ENCOUNTER — Encounter: Payer: Self-pay | Admitting: Nurse Practitioner

## 2024-02-04 MED ORDER — MEDROXYPROGESTERONE ACETATE 150 MG/ML IM SUSP
150.0000 mg | INTRAMUSCULAR | Status: AC
  Administered 2024-04-29 – 2024-10-09 (×3): 150 mg via INTRAMUSCULAR

## 2024-02-04 NOTE — Progress Notes (Signed)
 [employer] [facility] [address] [place] [address] Main phone: [phone]  Family Planning Visit - Repeat Yearly Visit  Subjective:  Evelyn Stevenson is a 38 y.o. G2P2002  being seen today for an annual wellness visit and to discuss contraception options. The patient is currently using hormonal injection for pregnancy prevention. Patient does not want a pregnancy in the next year.   Patient reports they are looking for a method with the following characteristics:  High efficacy at preventing pregnancy  Patient has the following medical problems:  Patient Active Problem List   Diagnosis Date Noted   Shortness of breath 07/12/2023   Other fatigue 02/26/2023   Overweight 02/26/2023   CIN II (cervical intraepithelial neoplasia II)    Abnormal Pap smear of cervix  09/27/21 ASCUS HPV+ 10/17/2021   H/O LEEP age 1 09/27/2021   Smoker 4-10 cpd 09/27/2021   Asthma 07/19/2020   Obesity,  BMI=43.1 12/18/2018   History of cesarean delivery 10/26/2018   Family history of autism     Chief Complaint  Patient presents with   Annual Exam    Pt is here for PE and Depo    HPI Patient is a pleasant 38 y.o. female who presents to the clinic today requesting PE, PAP, and depo. She does not want STI testing. Patient's only concern today is weight gain since beginning depo.  She reports 1 female partner in the past 12 months, practices vaginal and oral sex, uses condoms sometimes, and has no STI history. The patient indicates last sexual encounter was about 3 weeks ago. She indicates spotting around the time of depo injection due, but does not have regular monthly periods. Patient does have an extensive PAP history. See below.   Review of Systems  Constitutional:  Negative for weight loss.       Weight Gain  HENT:  Negative for sore throat.   Eyes:  Negative for blurred vision.  Respiratory:  Negative for cough, shortness of breath  and wheezing.   Cardiovascular:  Negative for chest pain and claudication.  Gastrointestinal:  Negative for nausea and vomiting.  Genitourinary:  Negative for dysuria and frequency.  Skin:  Negative for rash.  Neurological:  Negative for dizziness, seizures and headaches.  Endo/Heme/Allergies:  Does not bruise/bleed easily.    See flowsheet for other program required questions.   Diabetes screening This patient is 38 y.o. with a BMI of Body mass index is 43.68 kg/m.Marland Kitchen  Is patient eligible for diabetes screening (age >35 and BMI >25)?  yes  Was Hgb A1c ordered? No-Patient has a PCP and on a GLP-1.   STI screening Patient reports 1 of partners in last year.  Does this patient desire STI screening?  No - personal choice.  Hepatitis C screening Has patient been screened once for HCV in the past?  No  No results found for: "HCVAB"  Does the patient meet criteria for HCV testing? Yes  (If yes-- Screen for HCV through [facility]) Criteria:  Since the last HCV result, does the patient have any of the following? - Current drug use - Have a partner with drug use - Has been incarcerated  Hepatitis B screening Does the patient meet criteria for HBV testing? Yes Criteria:  -Household, sexual or needle sharing contact with HBV -History of drug use -HIV positive -Those with known Hep C  Cervical Cancer Screening  Result Date Procedure Results Follow-ups  03/16/2023 Surgical pathology SURGICAL  PATHOLOGY: SURGICAL PATHOLOGY CASE: [id number] PATIENT: Evelyn Stevenson Surgical Pathology Report     Clinical History: ASCUS, HPV positive, CIN 2 (cm)     FINAL MICROSCOPIC DIAGNOSIS:  A. CERVIX, 1 O'CLOCK, BIOPSY: Low-grade squamous intraepithel...   01/22/2023 IGP, Aptima HPV DIAGNOSIS:: Comment (A) HPV Aptima: Positive (A) Specimen adequacy:: Comment Clinician Provided ICD10: Comment Performed by:: Comment Electronically signed by:: Comment PAP Smear Comment: . PATHOLOGIST  PROVIDED ICD10:: Comment Note:: Comment Test Methodology: Comment   01/17/2022 Surgical pathology SURGICAL PATHOLOGY: SURGICAL PATHOLOGY CASE: [id number] PATIENT: Evelyn Stevenson Surgical Pathology Report     Specimen Submitted: A. Ectocervix, short noon, long 3 B. Endocervix C. Post LEEP ECC  Clinical History: CIN 2-3      DIAGNOSIS: A. ECTOCERVIX...   11/10/2021 Surgical pathology SURGICAL PATHOLOGY: SURGICAL PATHOLOGY CASE: [id number] PATIENT: Evelyn Stevenson Surgical Pathology Report     Clinical History: ASC-US (nt)     FINAL MICROSCOPIC DIAGNOSIS:  A. CERVIX, 12 O'CLOCK, BIOPSY: - High-grade squamous intraepithelial lesion (CIN ...   09/27/2021 IGP, Aptima HPV DIAGNOSIS:: Comment (A) Note:: Comment Test Methodology: CANCELED HPV Aptima: Positive (A) Recommendation:: Comment (A) Specimen adequacy:: Comment Clinician Provided ICD10: Comment Performed by:: Comment QC reviewed by:: Comment Electronically signed by:: Comment PAP Smear Comment: . PATHOLOGIST PROVIDED ICD10:: Comment   03/16/2016 HM PAP SMEAR HM Pap smear: Negative     Health Maintenance Due  Topic Date Due   Pneumococcal Vaccine 75-24 Years old (1 of 2 - PCV) Never done   Hepatitis C Screening  Never done   HPV VACCINES (2 - 3-dose SCDM series) 03/13/2022   INFLUENZA VACCINE  Never done   COVID-19 Vaccine (1 - 2024-25 season) Never done    The following portions of the patient's history were reviewed and updated as appropriate: allergies, current medications, past family history, past medical history, past social history, past surgical history and problem list. Problem list updated.  Objective:   Vitals:   01/28/24 0851  BP: 131/81  Pulse: 83  Weight: 295 lb 12.8 oz (134.2 kg)    Physical Exam Vitals and nursing note reviewed. Chaperone present: Declined.  Constitutional:      Appearance: Normal appearance.  HENT:     Head: Normocephalic.     Salivary Glands: Right  salivary gland is not diffusely enlarged or tender. Left salivary gland is not diffusely enlarged or tender.     Mouth/Throat:     Lips: Pink. No lesions.     Mouth: Mucous membranes are moist.     Tongue: No lesions. Tongue does not deviate from midline.     Pharynx: Oropharynx is clear. Uvula midline. No oropharyngeal exudate or posterior oropharyngeal erythema.     Tonsils: No tonsillar exudate.  Eyes:     General:        Right eye: No discharge.        Left eye: No discharge.  Neck:     Thyroid: No thyroid mass or thyroid tenderness.     Trachea: Trachea and phonation normal. No tracheal tenderness or tracheal deviation.  Cardiovascular:     Rate and Rhythm: Normal rate and regular rhythm.     Heart sounds: Normal heart sounds, S1 normal and S2 normal.  Pulmonary:     Effort: Pulmonary effort is normal.     Breath sounds: Normal breath sounds and air entry.  Chest:     Comments: CBE declined. Abdominal:     General: Abdomen is flat. Bowel sounds are normal.  Palpations: Abdomen is soft.  Genitourinary:    General: Normal vulva.     Exam position: Lithotomy position.     Pubic Area: No rash or pubic lice.      Tanner stage (genital): 5.     Labia:        Right: No rash, tenderness, lesion or injury.        Left: No rash, tenderness, lesion or injury.      Vagina: Normal. No signs of injury and foreign body. No vaginal discharge, erythema, tenderness, bleeding or lesions.     Cervix: Normal. No cervical motion tenderness, discharge, friability, lesion, erythema, cervical bleeding or eversion.     Uterus: Normal.      Adnexa: Right adnexa normal and left adnexa normal.     Comments: pH<4.5 Lymphadenopathy:     Head:     Right side of head: No submental, submandibular, tonsillar, preauricular or posterior auricular adenopathy.     Left side of head: No submental, submandibular, tonsillar, preauricular or posterior auricular adenopathy.     Cervical: No cervical  adenopathy.     Right cervical: No superficial or posterior cervical adenopathy.    Left cervical: No superficial or posterior cervical adenopathy.     Upper Body:     Right upper body: No supraclavicular or axillary adenopathy.     Left upper body: No supraclavicular or axillary adenopathy.     Lower Body: No right inguinal adenopathy. No left inguinal adenopathy.  Skin:    General: Skin is warm and dry.     Findings: No lesion or rash.     Comments: Skin tone appropriate for ethnicity.   Neurological:     Mental Status: She is alert and oriented to person, place, and time.  Psychiatric:        Attention and Perception: Attention and perception normal.        Mood and Affect: Mood and affect normal.        Speech: Speech normal.        Behavior: Behavior normal. Behavior is cooperative.        Thought Content: Thought content normal.     Assessment and Plan:  ONETTA SPAINHOWER is a 38 y.o. female G2P2002 presenting to the [facility] Department for an yearly wellness and contraception visit  1. Family planning (Primary) Contraception counseling: Reviewed options based on patient desire and reproductive life plan. Patient is interested in Hormonal Injection. This was provided to the patient today.  Risks, benefits, and typical effectiveness rates were reviewed.  Questions were answered.  Written information was also given to the patient to review.    The patient will follow up in  3 months for surveillance.  The patient was told to call with any further questions, or with any concerns about this method of contraception.  Emphasized use of condoms 100% of the time for STI prevention.  Educated on ECP and assessed need for ECP. Patient does not qualify for ECP based on no unprotected sex.  - medroxyPROGESTERone (DEPO-PROVERA) injection 150 mg  2. Well woman exam No CBE performed today as patient declined. Should be performed at next well woman exam in 1 year. Mammogram to  begin in 1 year.  PAP performed. Next due will depend on results. Patient has significant cervical cytology with biopsy history.  Discussed lifestyle modifications to promote healthy weight management including diet and exercise such as increasing fruit, vegetable, and whole grain intake. Being mindful of portion size  and to limit high-fat, high-cholesterol foods like those that are fried, red meats, and dairy products. Also limit simple carbohydrates and foods and beverages high in sugar.  Increase water intake.   - IGP, Aptima HPV  Return in about 3 months (around 04/29/2024) for depo injection.  No future appointments.  Edmonia James, NP

## 2024-02-05 ENCOUNTER — Telehealth: Payer: Self-pay

## 2024-02-05 NOTE — Telephone Encounter (Signed)
 Pt LM asking for call back but did not state what she needed

## 2024-02-06 LAB — IGP, APTIMA HPV
HPV Aptima: POSITIVE — AB
PAP Smear Comment: 0

## 2024-02-07 ENCOUNTER — Encounter: Payer: Self-pay | Admitting: Nurse Practitioner

## 2024-02-07 ENCOUNTER — Encounter: Payer: Self-pay | Admitting: Cardiology

## 2024-02-07 ENCOUNTER — Ambulatory Visit: Admitting: Cardiology

## 2024-02-07 VITALS — BP 134/70 | HR 84 | Ht 69.0 in | Wt 293.0 lb

## 2024-02-07 DIAGNOSIS — E66813 Obesity, class 3: Secondary | ICD-10-CM | POA: Diagnosis not present

## 2024-02-07 DIAGNOSIS — Z1329 Encounter for screening for other suspected endocrine disorder: Secondary | ICD-10-CM

## 2024-02-07 DIAGNOSIS — Z713 Dietary counseling and surveillance: Secondary | ICD-10-CM | POA: Diagnosis not present

## 2024-02-07 DIAGNOSIS — Z6841 Body Mass Index (BMI) 40.0 and over, adult: Secondary | ICD-10-CM

## 2024-02-07 DIAGNOSIS — Z131 Encounter for screening for diabetes mellitus: Secondary | ICD-10-CM

## 2024-02-07 DIAGNOSIS — Z13220 Encounter for screening for lipoid disorders: Secondary | ICD-10-CM

## 2024-02-07 NOTE — Progress Notes (Signed)
 Established Patient Office Visit  Subjective:  Patient ID: Evelyn Stevenson, female    DOB: [birth year]  Age: 38 y.o. MRN: [id number]  Chief Complaint  Patient presents with   Weight Loss    Discuss Weightloss    Patient in office to discuss weight loss options. Patient doing well, no complaints today.  No recent fasting lab work. Patient will return for fasting lab work. Discussed GLP-1 injectable medications. Pending lab results. Will send in appropriate medication. Return 4 weeks after starting medication.     No other concerns at this time.   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   Asthma    as a child   Heart murmur    PONV (postoperative nausea and vomiting)     Past Surgical History:  Procedure Laterality Date   CESAREAN SECTION  2007   LEEP  2005   LEEP N/A 01/17/2022   Procedure: LOOP ELECTROSURGICAL EXCISION PROCEDURE (LEEP);  Surgeon: Natale Milch, MD;  Location: [facility] ORS;  Service: Gynecology;  Laterality: N/A;    Social History   Socioeconomic History   Marital status: Single    Spouse name: Not on file   Number of children: 2   Years of education: Not on file   Highest education level: 12th grade  Occupational History   Occupation: Administrator, sports  Tobacco Use   Smoking status: Every Day    Current packs/day: 0.50    Types: Cigarettes   Smokeless tobacco: Never   Tobacco comments:    smokes every other day  Vaping Use   Vaping status: Never Used  Substance and Sexual Activity   Alcohol use: Yes    Alcohol/week: 4.0 standard drinks of alcohol    Types: 4 Shots of liquor per week    Comment: occassionally   Drug use: Not Currently    Types: Marijuana    Comment: last used beginning of year   Sexual activity: Yes    Partners: Male    Birth control/protection: Condom, Injection  Other Topics Concern   Not on file  Social History Narrative   Not on file   Social  Drivers of Health   Financial Resource Strain: Not on file  Food Insecurity: Not on file  Transportation Needs: Not on file  Physical Activity: Not on file  Stress: Not on file  Social Connections: Not on file  Intimate Partner Violence: Not At Risk (01/28/2024)   Humiliation, Afraid, Rape, and Kick questionnaire    Fear of Current or Ex-Partner: No    Emotionally Abused: No    Physically Abused: No    Sexually Abused: No    Family History  Problem Relation Age of Onset   Hypertension Mother    Heart failure Mother    Hypertension Father    Diabetes Father    Cancer Maternal Grandmother    Arthritis Maternal Grandmother    Diabetes Maternal Grandmother    Hypertension Maternal Grandmother    COPD Maternal Grandmother    Prostate cancer Maternal Grandfather    Cirrhosis Maternal Grandfather    Hypertension Maternal Grandfather    Hypertension Paternal Grandmother    Hypertension Paternal Grandfather    Asthma Daughter    Breast cancer Neg Hx     No Known Allergies  Outpatient Medications Prior to Visit  Medication Sig   [DISCONTINUED] Liraglutide -Weight Management (SAXENDA) 18 MG/3ML SOPN Inject 0.6 mg into the skin daily. (  Patient not taking: Reported on 02/07/2024)   Facility-Administered Medications Prior to Visit  Medication Dose Route Frequency Provider   medroxyPROGESTERone (DEPO-PROVERA) injection 150 mg  150 mg Intramuscular Q90 days     Review of Systems  Constitutional: Negative.   HENT: Negative.    Eyes: Negative.   Respiratory: Negative.  Negative for shortness of breath.   Cardiovascular: Negative.  Negative for chest pain.  Gastrointestinal: Negative.  Negative for abdominal pain, constipation and diarrhea.  Genitourinary: Negative.   Musculoskeletal:  Negative for joint pain and myalgias.  Skin: Negative.   Neurological: Negative.  Negative for dizziness and headaches.  Endo/Heme/Allergies: Negative.   All other systems reviewed and are  negative.      Objective:   BP 134/70   Pulse 84   Ht 5\' 9"  (1.753 m)   Wt 293 lb (132.9 kg)   LMP 01/25/2024 Comment: Pt report spotting around time of next depo shot.  SpO2 98%   BMI 43.27 kg/m   Vitals:   02/07/24 1135  BP: 134/70  Pulse: 84  Height: 5\' 9"  (1.753 m)  Weight: 293 lb (132.9 kg)  SpO2: 98%  BMI (Calculated): 43.25    Physical Exam Vitals and nursing note reviewed.  Constitutional:      Appearance: Normal appearance. She is normal weight.  HENT:     Head: Normocephalic and atraumatic.     Nose: Nose normal.     Mouth/Throat:     Mouth: Mucous membranes are moist.  Eyes:     Extraocular Movements: Extraocular movements intact.     Conjunctiva/sclera: Conjunctivae normal.     Pupils: Pupils are equal, round, and reactive to light.  Cardiovascular:     Rate and Rhythm: Normal rate and regular rhythm.     Pulses: Normal pulses.     Heart sounds: Normal heart sounds.  Pulmonary:     Effort: Pulmonary effort is normal.     Breath sounds: Normal breath sounds.  Abdominal:     General: Abdomen is flat. Bowel sounds are normal.     Palpations: Abdomen is soft.  Musculoskeletal:        General: Normal range of motion.     Cervical back: Normal range of motion.  Skin:    General: Skin is warm and dry.  Neurological:     General: No focal deficit present.     Mental Status: She is alert and oriented to person, place, and time.  Psychiatric:        Mood and Affect: Mood normal.        Behavior: Behavior normal.        Thought Content: Thought content normal.        Judgment: Judgment normal.      No results found for any visits on 02/07/24.  Recent Results (from the past 2160 hours)  IGP, Aptima HPV     Status: Abnormal   Collection Time: 01/28/24 11:53 AM  Result Value Ref Range   DIAGNOSIS: Comment     Comment: NEGATIVE FOR INTRAEPITHELIAL LESION OR MALIGNANCY. THIS SPECIMEN WAS RESCREENED AS PART OF OUR QUALITY CONTROL PROGRAM.     Specimen adequacy: Comment     Comment: Satisfactory for evaluation. Endocervical and/or squamous metaplastic cells (endocervical component) are present.    Clinician Provided ICD10 Comment     Comment: Z01.419   Performed by: Comment     Comment: Damian Leavell, Cytotechnologist (ASCP)   QC reviewed by: Comment     CommentRinaldo Cloud  [place], Therapist, sports (ASCP)   PAP Smear Comment .    Note: Comment     Comment: The Pap smear is a screening test designed to aid in the detection of premalignant and malignant conditions of the uterine cervix.  It is not a diagnostic procedure and should not be used as the sole means of detecting cervical cancer.  Both false-positive and false-negative reports do occur.    Test Methodology Comment     Comment: This liquid based ThinPrep(R) pap test was screened with the use of an image guided system.    HPV Aptima Positive (A) Negative    Comment: This nucleic acid amplification test detects fourteen high-risk HPV types (16,18,31,33,35,39,45,51,52,56,58,59,66,68) without differentiation.       Assessment & Plan:  Return for fasting lab work. GLP-1 pending lab results.   Problem List Items Addressed This Visit       Other   Obesity,  BMI=43.1   Weight loss counseling, encounter for - Primary   Other Visit Diagnoses       Diabetes mellitus screening       Relevant Orders   Hemoglobin A1c   CMP14+EGFR     Thyroid disorder screening       Relevant Orders   TSH     Lipid screening       Relevant Orders   Lipid Profile       Return in about 6 weeks (around 03/20/2024).   Total time spent: 25 minutes  [employer], NP  02/07/2024   This document may have been prepared by Dragon Voice Recognition software and as such may include unintentional dictation errors.

## 2024-02-07 NOTE — Telephone Encounter (Signed)
 Pt had appt today and asked for the saxenda to be taken off her medlist since she was unable to get it back when it was prescribed

## 2024-02-07 NOTE — Progress Notes (Signed)
 NILM, HPV (+). Per ASCCP guidelines, with this patient's history, repeat PAP in 1 year with HPV co-testing is recommended. Please mail card to patient to repeat PAP with co-testing in 1 year. Thank you, Marylu Lund L. Abundio Teuscher, FNP-C

## 2024-02-08 ENCOUNTER — Other Ambulatory Visit

## 2024-02-09 LAB — LIPID PANEL
Chol/HDL Ratio: 3.4 ratio (ref 0.0–4.4)
Cholesterol, Total: 156 mg/dL (ref 100–199)
HDL: 46 mg/dL (ref >39)
LDL Chol Calc (NIH): 96 mg/dL (ref 0–99)
Triglycerides: 70 mg/dL (ref 0–149)
VLDL Cholesterol Cal: 14 mg/dL (ref 5–40)

## 2024-02-09 LAB — CMP14+EGFR
ALT: 9 IU/L (ref 0–32)
AST: 11 IU/L (ref 0–40)
Albumin: 3.9 g/dL (ref 3.9–4.9)
Alkaline Phosphatase: 84 IU/L (ref 44–121)
BUN/Creatinine Ratio: 13 (ref 9–23)
BUN: 13 mg/dL (ref 6–20)
Bilirubin Total: 0.5 mg/dL (ref 0.0–1.2)
CO2: 21 mmol/L (ref 20–29)
Calcium: 9.1 mg/dL (ref 8.7–10.2)
Chloride: 107 mmol/L — ABNORMAL HIGH (ref 96–106)
Creatinine, Ser: 0.97 mg/dL (ref 0.57–1.00)
Globulin, Total: 2.6 g/dL (ref 1.5–4.5)
Glucose: 79 mg/dL (ref 70–99)
Potassium: 3.9 mmol/L (ref 3.5–5.2)
Sodium: 140 mmol/L (ref 134–144)
Total Protein: 6.5 g/dL (ref 6.0–8.5)
eGFR: 76 mL/min/{1.73_m2} (ref >59)

## 2024-02-09 LAB — HEMOGLOBIN A1C
Est. average glucose Bld gHb Est-mCnc: 103 mg/dL
Hgb A1c MFr Bld: 5.2 % (ref 4.8–5.6)

## 2024-02-09 LAB — TSH: TSH: 2.31 u[IU]/mL (ref 0.450–4.500)

## 2024-02-11 ENCOUNTER — Other Ambulatory Visit: Payer: Self-pay | Admitting: Cardiology

## 2024-02-11 MED ORDER — WEGOVY 0.25 MG/0.5ML ~~LOC~~ SOAJ: 0.2500 mg | SUBCUTANEOUS | 3 refills | Status: DC

## 2024-02-11 NOTE — Progress Notes (Signed)
 Informed via Mychart message

## 2024-03-20 ENCOUNTER — Encounter: Payer: Self-pay | Admitting: Cardiology

## 2024-03-20 ENCOUNTER — Ambulatory Visit: Admitting: Cardiology

## 2024-03-20 VITALS — BP 114/78 | HR 100 | Ht 69.0 in | Wt 287.0 lb

## 2024-03-20 DIAGNOSIS — Z6841 Body Mass Index (BMI) 40.0 and over, adult: Secondary | ICD-10-CM

## 2024-03-20 DIAGNOSIS — Z713 Dietary counseling and surveillance: Secondary | ICD-10-CM | POA: Diagnosis not present

## 2024-03-20 DIAGNOSIS — Z0130 Encounter for examination of blood pressure without abnormal findings: Secondary | ICD-10-CM

## 2024-03-20 DIAGNOSIS — E66813 Obesity, class 3: Secondary | ICD-10-CM

## 2024-03-20 MED ORDER — WEGOVY 0.25 MG/0.5ML ~~LOC~~ SOAJ: 0.5000 mg | SUBCUTANEOUS | 9 refills | Status: DC

## 2024-03-20 NOTE — Progress Notes (Signed)
 Established Patient Office Visit  Subjective:  Patient ID: Evelyn Stevenson, female    DOB: 02-Mar-1986  Age: 38 y.o. MRN: [id number]  Chief Complaint  Patient presents with   Follow-up    6 weeks follow up    Patient in office for 6 week follow up, discuss recent lab work. Patient doing well, no complaints today.  Discussed recent lab work, all within normal limits. Patient started Wegovy  4 weeks ago. Patient tolerating dose, no side effects. Will increase to 0.5 mg weekly.     No other concerns at this time.   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   Asthma    as a child   Heart murmur    PONV (postoperative nausea and vomiting)     Past Surgical History:  Procedure Laterality Date   CESAREAN SECTION  2007   LEEP  2005   LEEP N/A 01/17/2022   Procedure: LOOP ELECTROSURGICAL EXCISION PROCEDURE (LEEP);  Surgeon: Heron Lord, MD;  Location: [facility];  Service: Gynecology;  Laterality: N/A;    Social History   Socioeconomic History   Marital status: Single    Spouse name: Not on file   Number of children: 2   Years of education: Not on file   Highest education level: 12th grade  Occupational History   Occupation: Administrator, sports  Tobacco Use   Smoking status: Every Day    Current packs/day: 0.50    Types: Cigarettes   Smokeless tobacco: Never   Tobacco comments:    smokes every other day  Vaping Use   Vaping status: Never Used  Substance and Sexual Activity   Alcohol use: Yes    Alcohol/week: 4.0 standard drinks of alcohol    Types: 4 Shots of liquor per week    Comment: occassionally   Drug use: Not Currently    Types: Marijuana    Comment: last used beginning of year   Sexual activity: Yes    Partners: Male    Birth control/protection: Condom, Injection  Other Topics Concern   Not on file  Social History Narrative   Not on file   Social Drivers of Health   Financial Resource  Strain: Not on file  Food Insecurity: Not on file  Transportation Needs: Not on file  Physical Activity: Not on file  Stress: Not on file  Social Connections: Not on file  Intimate Partner Violence: Not At Risk (01/28/2024)   Humiliation, Afraid, Rape, and Kick questionnaire    Fear of Current or Ex-Partner: No    Emotionally Abused: No    Physically Abused: No    Sexually Abused: No    Family History  Problem Relation Age of Onset   Hypertension Mother    Heart failure Mother    Hypertension Father    Diabetes Father    Cancer Maternal Grandmother    Arthritis Maternal Grandmother    Diabetes Maternal Grandmother    Hypertension Maternal Grandmother    COPD Maternal Grandmother    Prostate cancer Maternal Grandfather    Cirrhosis Maternal Grandfather    Hypertension Maternal Grandfather    Hypertension Paternal Grandmother    Hypertension Paternal Grandfather    Asthma Daughter    Breast cancer Neg Hx     No Known Allergies  Outpatient Medications Prior to Visit  Medication Sig   [DISCONTINUED] Semaglutide -Weight Management (WEGOVY ) 0.25 MG/0.5ML SOAJ Inject 0.25 mg into the skin once  a week.   Facility-Administered Medications Prior to Visit  Medication Dose Route Frequency Provider   medroxyPROGESTERone  (DEPO-PROVERA ) injection 150 mg  150 mg Intramuscular Q90 days     Review of Systems  Constitutional: Negative.   HENT: Negative.    Eyes: Negative.   Respiratory: Negative.  Negative for shortness of breath.   Cardiovascular: Negative.  Negative for chest pain.  Gastrointestinal: Negative.  Negative for abdominal pain, constipation and diarrhea.  Genitourinary: Negative.   Musculoskeletal:  Negative for joint pain and myalgias.  Skin: Negative.   Neurological: Negative.  Negative for dizziness and headaches.  Endo/Heme/Allergies: Negative.   All other systems reviewed and are negative.      Objective:   BP 114/78   Pulse 100   Ht 5\' 9"  (1.753 m)    Wt 287 lb (130.2 kg)   SpO2 96%   BMI 42.38 kg/m   Vitals:   03/20/24 0913  BP: 114/78  Pulse: 100  Height: 5\' 9"  (1.753 m)  Weight: 287 lb (130.2 kg)  SpO2: 96%  BMI (Calculated): 42.36    Physical Exam Vitals and nursing note reviewed.  Constitutional:      Appearance: Normal appearance. She is normal weight.  HENT:     Head: Normocephalic and atraumatic.     Nose: Nose normal.     Mouth/Throat:     Mouth: Mucous membranes are moist.  Eyes:     Extraocular Movements: Extraocular movements intact.     Conjunctiva/sclera: Conjunctivae normal.     Pupils: Pupils are equal, round, and reactive to light.  Cardiovascular:     Rate and Rhythm: Normal rate and regular rhythm.     Pulses: Normal pulses.     Heart sounds: Normal heart sounds.  Pulmonary:     Effort: Pulmonary effort is normal.     Breath sounds: Normal breath sounds.  Abdominal:     General: Abdomen is flat. Bowel sounds are normal.     Palpations: Abdomen is soft.  Musculoskeletal:        General: Normal range of motion.     Cervical back: Normal range of motion.  Skin:    General: Skin is warm and dry.  Neurological:     General: No focal deficit present.     Mental Status: She is alert and oriented to person, place, and time.  Psychiatric:        Mood and Affect: Mood normal.        Behavior: Behavior normal.        Thought Content: Thought content normal.        Judgment: Judgment normal.      No results found for any visits on 03/20/24.  Recent Results (from the past 2160 hours)  IGP, Aptima HPV     Status: Abnormal   Collection Time: 01/28/24 11:53 AM  Result Value Ref Range   DIAGNOSIS: Comment     Comment: NEGATIVE FOR INTRAEPITHELIAL LESION OR MALIGNANCY. THIS SPECIMEN WAS RESCREENED AS PART OF OUR QUALITY CONTROL PROGRAM.    Specimen adequacy: Comment     Comment: Satisfactory for evaluation. Endocervical and/or squamous metaplastic cells (endocervical component) are present.     Clinician Provided ICD10 Comment     Comment: Z01.419   Performed by: Comment     Comment: Jonaa Smith, Cytotechnologist (ASCP)   QC reviewed by: Comment     Comment: Jacquelene Mathieu, Cytotechnologist (ASCP)   PAP Smear Comment .    Note: Comment  Comment: The Pap smear is a screening test designed to aid in the detection of premalignant and malignant conditions of the uterine cervix.  It is not a diagnostic procedure and should not be used as the sole means of detecting cervical cancer.  Both false-positive and false-negative reports do occur.    Test Methodology Comment     Comment: This liquid based ThinPrep(R) pap test was screened with the use of an image guided system.    HPV Aptima Positive (A) Negative    Comment: This nucleic acid amplification test detects fourteen high-risk HPV types (16,18,31,33,35,39,45,51,52,56,58,59,66,68) without differentiation.   Lipid Profile     Status: None   Collection Time: 02/08/24  8:18 AM  Result Value Ref Range   Cholesterol, Total 156 100 - 199 mg/dL   Triglycerides 70 0 - 149 mg/dL   HDL 46 >16 mg/dL   VLDL Cholesterol Cal 14 5 - 40 mg/dL   LDL Chol Calc (NIH) 96 0 - 99 mg/dL   Chol/HDL Ratio 3.4 0.0 - 4.4 ratio    Comment:                                   T. Chol/HDL Ratio                                             Men  Women                               1/2 Avg.Risk  3.4    3.3                                   Avg.Risk  5.0    4.4                                2X Avg.Risk  9.6    7.1                                3X Avg.Risk 23.4   11.0   Hemoglobin A1c     Status: None   Collection Time: 02/08/24  8:18 AM  Result Value Ref Range   Hgb A1c MFr Bld 5.2 4.8 - 5.6 %    Comment:          Prediabetes: 5.7 - 6.4          Diabetes: >6.4          Glycemic control for adults with diabetes: <7.0    Est. average glucose Bld gHb Est-mCnc 103 mg/dL  [id number]     Status: Abnormal   Collection Time: 02/08/24  8:18 AM  Result  Value Ref Range   Glucose 79 70 - 99 mg/dL   BUN 13 6 - 20 mg/dL   Creatinine, Ser 9.81 0.57 - 1.00 mg/dL   eGFR 76 >19 JY/NWG/9.56   BUN/Creatinine Ratio 13 9 - 23   Sodium 140 134 - 144 mmol/L   Potassium 3.9 3.5 - 5.2 mmol/L   Chloride 107 (H) 96 - 106 mmol/L   CO2  21 20 - 29 mmol/L   Calcium 9.1 8.7 - 10.2 mg/dL   Total Protein 6.5 6.0 - 8.5 g/dL   Albumin 3.9 3.9 - 4.9 g/dL   Globulin, Total 2.6 1.5 - 4.5 g/dL   Bilirubin Total 0.5 0.0 - 1.2 mg/dL   Alkaline Phosphatase 84 44 - 121 IU/L   AST 11 0 - 40 IU/L   ALT 9 0 - 32 IU/L  TSH     Status: None   Collection Time: 02/08/24  8:18 AM  Result Value Ref Range   TSH 2.310 0.450 - 4.500 uIU/mL      Assessment & Plan:  Increase Wegovy  to 0.5 mg weekly.   Problem List Items Addressed This Visit       Other   Obesity,  BMI=43.1   Relevant Medications   Semaglutide -Weight Management (WEGOVY ) 0.25 MG/0.5ML SOAJ   Weight loss counseling, encounter for - Primary    Return in about 3 months (around 06/20/2024) for with fasting lab work prior.   Total time spent: 25 minutes  Google, NP  03/20/2024   This document may have been prepared by Dragon Voice Recognition software and as such may include unintentional dictation errors.

## 2024-04-29 ENCOUNTER — Ambulatory Visit (LOCAL_COMMUNITY_HEALTH_CENTER)

## 2024-04-29 VITALS — BP 117/65 | Ht 69.0 in | Wt 281.5 lb

## 2024-04-29 DIAGNOSIS — Z309 Encounter for contraceptive management, unspecified: Secondary | ICD-10-CM

## 2024-04-29 DIAGNOSIS — Z3009 Encounter for other general counseling and advice on contraception: Secondary | ICD-10-CM

## 2024-04-29 DIAGNOSIS — Z3042 Encounter for surveillance of injectable contraceptive: Secondary | ICD-10-CM

## 2024-04-29 DIAGNOSIS — Z30013 Encounter for initial prescription of injectable contraceptive: Secondary | ICD-10-CM

## 2024-04-29 NOTE — Progress Notes (Signed)
 13 Weeks   1 Days since last Depo    Voices no concerns today.  Counseled to adhere to 11 to 13 week intervals between depo injections for optimal benefit.  Depo given today per order by J Idol  dated 02/04/24 .  Tolerated well R Delt.  Next depo due 07/15/2024,  has reminder card.  Jwan Hornbaker, RN

## 2024-05-13 ENCOUNTER — Other Ambulatory Visit: Payer: Self-pay

## 2024-05-13 MED ORDER — WEGOVY 0.5 MG/0.5ML ~~LOC~~ SOAJ
0.5000 mg | SUBCUTANEOUS | 0 refills | Status: DC
Start: 2024-05-13 — End: 2024-06-20

## 2024-06-20 ENCOUNTER — Ambulatory Visit (INDEPENDENT_AMBULATORY_CARE_PROVIDER_SITE_OTHER): Admitting: Cardiology

## 2024-06-20 ENCOUNTER — Encounter: Payer: Self-pay | Admitting: Cardiology

## 2024-06-20 VITALS — BP 122/72 | HR 88 | Ht 69.0 in | Wt 273.2 lb

## 2024-06-20 DIAGNOSIS — Z6841 Body Mass Index (BMI) 40.0 and over, adult: Secondary | ICD-10-CM | POA: Diagnosis not present

## 2024-06-20 DIAGNOSIS — Z131 Encounter for screening for diabetes mellitus: Secondary | ICD-10-CM | POA: Diagnosis not present

## 2024-06-20 DIAGNOSIS — Z13220 Encounter for screening for lipoid disorders: Secondary | ICD-10-CM

## 2024-06-20 DIAGNOSIS — Z713 Dietary counseling and surveillance: Secondary | ICD-10-CM

## 2024-06-20 DIAGNOSIS — E66813 Obesity, class 3: Secondary | ICD-10-CM

## 2024-06-20 DIAGNOSIS — Z0130 Encounter for examination of blood pressure without abnormal findings: Secondary | ICD-10-CM

## 2024-06-20 DIAGNOSIS — Z1329 Encounter for screening for other suspected endocrine disorder: Secondary | ICD-10-CM

## 2024-06-20 DIAGNOSIS — R7301 Impaired fasting glucose: Secondary | ICD-10-CM | POA: Diagnosis not present

## 2024-06-20 MED ORDER — WEGOVY 1 MG/0.5ML ~~LOC~~ SOAJ: 1.0000 mg | SUBCUTANEOUS | 4 refills | Status: DC

## 2024-06-20 NOTE — Progress Notes (Signed)
 Established Patient Office Visit  Subjective:  Patient ID: Evelyn Stevenson, female    DOB: 03-22-1986  Age: 38 y.o. MRN: [id number]  Chief Complaint  Patient presents with   Follow-up    Follow up. Having pain from left hip that travels down leg. Having migraines from Wegovy .     Patient in office for 3 month follow up. Patient did not have fasting lab work done.Patient doing well over all. States she had a migraine last weekend, started on Friday, took Excedrin Migraine with relief. Migraine returned on Sunday, took more Excedrin Migraine with relief. Patient reports being outside in the heat, thinks that may have contributed to it. Patient will notify office if migraines return and Excedrin does not help.  Patient taking and tolerating Wegovy , will increase dose to 1 mg weekly.  Patient fasting, will get labs today.     No other concerns at this time.   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   Asthma    as a child   Heart murmur    PONV (postoperative nausea and vomiting)     Past Surgical History:  Procedure Laterality Date   CESAREAN SECTION  2007   LEEP  2005   LEEP N/A 01/17/2022   Procedure: LOOP ELECTROSURGICAL EXCISION PROCEDURE (LEEP);  Surgeon: Victor Claudell SAUNDERS, MD;  Location: [facility];  Service: Gynecology;  Laterality: N/A;    Social History   Socioeconomic History   Marital status: Single    Spouse name: Not on file   Number of children: 2   Years of education: Not on file   Highest education level: 12th grade  Occupational History   Occupation: Administrator, sports  Tobacco Use   Smoking status: Every Day    Current packs/day: 0.50    Types: Cigarettes   Smokeless tobacco: Never   Tobacco comments:    smokes every other day  Vaping Use   Vaping status: Never Used  Substance and Sexual Activity   Alcohol use: Yes    Alcohol/week: 4.0 standard drinks of alcohol    Types: 4 Shots of  liquor per week    Comment: occassionally   Drug use: Not Currently    Types: Marijuana    Comment: last used beginning of year   Sexual activity: Yes    Partners: Male    Birth control/protection: Condom, Injection  Other Topics Concern   Not on file  Social History Narrative   Not on file   Social Drivers of Health   Financial Resource Strain: Not on file  Food Insecurity: Not on file  Transportation Needs: Not on file  Physical Activity: Not on file  Stress: Not on file  Social Connections: Not on file  Intimate Partner Violence: Not At Risk (01/28/2024)   Humiliation, Afraid, Rape, and Kick questionnaire    Fear of Current or Ex-Partner: No    Emotionally Abused: No    Physically Abused: No    Sexually Abused: No    Family History  Problem Relation Age of Onset   Hypertension Mother    Heart failure Mother    Hypertension Father    Diabetes Father    Cancer Maternal Grandmother    Arthritis Maternal Grandmother    Diabetes Maternal Grandmother    Hypertension Maternal Grandmother    COPD Maternal Grandmother    Prostate cancer Maternal Grandfather    Cirrhosis Maternal Grandfather    Hypertension  Maternal Grandfather    Hypertension Paternal Grandmother    Hypertension Paternal Grandfather    Asthma Daughter    Breast cancer Neg Hx     No Known Allergies  Outpatient Medications Prior to Visit  Medication Sig   Probiotic Product (PROBIOTIC DAILY PO) Take by mouth.   [DISCONTINUED] Semaglutide -Weight Management (WEGOVY ) 0.5 MG/0.5ML SOAJ Inject 0.5 mg into the skin once a week.   Facility-Administered Medications Prior to Visit  Medication Dose Route Frequency Provider   medroxyPROGESTERone  (DEPO-PROVERA ) injection 150 mg  150 mg Intramuscular Q90 days     Review of Systems  Constitutional: Negative.   HENT: Negative.    Eyes: Negative.   Respiratory: Negative.  Negative for shortness of breath.   Cardiovascular: Negative.  Negative for chest pain.   Gastrointestinal: Negative.  Negative for abdominal pain, constipation and diarrhea.  Genitourinary: Negative.   Musculoskeletal:  Negative for joint pain and myalgias.  Skin: Negative.   Neurological:  Positive for headaches. Negative for dizziness.  Endo/Heme/Allergies: Negative.   All other systems reviewed and are negative.      Objective:   BP 122/72   Pulse 88   Ht 5' 9 (1.753 m)   Wt 273 lb 3.2 oz (123.9 kg)   SpO2 99%   BMI 40.34 kg/m   Vitals:   06/20/24 0846  BP: 122/72  Pulse: 88  Height: 5' 9 (1.753 m)  Weight: 273 lb 3.2 oz (123.9 kg)  SpO2: 99%  BMI (Calculated): 40.33    Physical Exam Vitals and nursing note reviewed.  Constitutional:      Appearance: Normal appearance. She is normal weight.  HENT:     Head: Normocephalic and atraumatic.     Nose: Nose normal.     Mouth/Throat:     Mouth: Mucous membranes are moist.  Eyes:     Extraocular Movements: Extraocular movements intact.     Conjunctiva/sclera: Conjunctivae normal.     Pupils: Pupils are equal, round, and reactive to light.  Cardiovascular:     Rate and Rhythm: Normal rate and regular rhythm.     Pulses: Normal pulses.     Heart sounds: Normal heart sounds.  Pulmonary:     Effort: Pulmonary effort is normal.     Breath sounds: Normal breath sounds.  Abdominal:     General: Abdomen is flat. Bowel sounds are normal.     Palpations: Abdomen is soft.  Musculoskeletal:        General: Normal range of motion.     Cervical back: Normal range of motion.  Skin:    General: Skin is warm and dry.  Neurological:     General: No focal deficit present.     Mental Status: She is alert and oriented to person, place, and time.  Psychiatric:        Mood and Affect: Mood normal.        Behavior: Behavior normal.        Thought Content: Thought content normal.        Judgment: Judgment normal.      No results found for any visits on 06/20/24.  No results found for this or any previous  visit (from the past 2160 hours).    Assessment & Plan:  Excedrin migraine for headaches.  Increase Wegovy  to 1 mg weekly. Fasting lab work today.  Problem List Items Addressed This Visit       Other   Obesity,  BMI=43.1   Relevant Medications   Semaglutide -Weight  Management (WEGOVY ) 1 MG/0.5ML SOAJ   Weight loss counseling, encounter for - Primary   Other Visit Diagnoses       Diabetes mellitus screening       Relevant Orders   Hemoglobin A1c     Thyroid disorder screening       Relevant Orders   TSH     Lipid screening       Relevant Orders   Lipid Profile     Impaired fasting blood sugar       Relevant Orders   CMP14+EGFR   Hemoglobin A1c       Return in about 4 months (around 10/20/2024) for fasting labs prior.   Total time spent: 25 minutes  Google, NP  06/20/2024   This document may have been prepared by Dragon Voice Recognition software and as such may include unintentional dictation errors.

## 2024-06-21 LAB — CMP14+EGFR
ALT: 11 IU/L (ref 0–32)
AST: 16 IU/L (ref 0–40)
Albumin: 4 g/dL (ref 3.9–4.9)
Alkaline Phosphatase: 82 IU/L (ref 44–121)
BUN/Creatinine Ratio: 16 (ref 9–23)
BUN: 13 mg/dL (ref 6–20)
Bilirubin Total: 0.4 mg/dL (ref 0.0–1.2)
CO2: 18 mmol/L — ABNORMAL LOW (ref 20–29)
Calcium: 8.9 mg/dL (ref 8.7–10.2)
Chloride: 103 mmol/L (ref 96–106)
Creatinine, Ser: 0.82 mg/dL (ref 0.57–1.00)
Globulin, Total: 2.9 g/dL (ref 1.5–4.5)
Glucose: 78 mg/dL (ref 70–99)
Potassium: 4.4 mmol/L (ref 3.5–5.2)
Sodium: 136 mmol/L (ref 134–144)
Total Protein: 6.9 g/dL (ref 6.0–8.5)
eGFR: 93 mL/min/1.73 (ref >59)

## 2024-06-21 LAB — HEMOGLOBIN A1C
Est. average glucose Bld gHb Est-mCnc: 97 mg/dL
Hgb A1c MFr Bld: 5 % (ref 4.8–5.6)

## 2024-06-21 LAB — LIPID PANEL
Chol/HDL Ratio: 4.2 ratio (ref 0.0–4.4)
Cholesterol, Total: 162 mg/dL (ref 100–199)
HDL: 39 mg/dL — ABNORMAL LOW (ref >39)
LDL Chol Calc (NIH): 106 mg/dL — ABNORMAL HIGH (ref 0–99)
Triglycerides: 88 mg/dL (ref 0–149)
VLDL Cholesterol Cal: 17 mg/dL (ref 5–40)

## 2024-06-21 LAB — TSH: TSH: 1.18 u[IU]/mL (ref 0.450–4.500)

## 2024-06-23 ENCOUNTER — Ambulatory Visit: Payer: Self-pay | Admitting: Cardiology

## 2024-06-23 NOTE — Progress Notes (Signed)
Pt informed

## 2024-07-22 ENCOUNTER — Ambulatory Visit

## 2024-07-22 VITALS — BP 115/66 | Ht 69.0 in | Wt 268.5 lb

## 2024-07-22 DIAGNOSIS — Z3009 Encounter for other general counseling and advice on contraception: Secondary | ICD-10-CM

## 2024-07-22 DIAGNOSIS — Z30013 Encounter for initial prescription of injectable contraceptive: Secondary | ICD-10-CM | POA: Diagnosis not present

## 2024-07-22 DIAGNOSIS — Z309 Encounter for contraceptive management, unspecified: Secondary | ICD-10-CM | POA: Diagnosis not present

## 2024-07-22 DIAGNOSIS — Z3042 Encounter for surveillance of injectable contraceptive: Secondary | ICD-10-CM

## 2024-07-22 NOTE — Progress Notes (Signed)
 12 weeks and 0 days post depo. Voices no concerns. Depo given today per order by JINNY Narrow, NP dated 02/04/2024. Tolerated well in L deltoid. Next depo due 10/07/2024; patient aware.  Evelyn CINDERELLA Shuck, RN

## 2024-08-08 ENCOUNTER — Other Ambulatory Visit: Payer: Self-pay | Admitting: Cardiology

## 2024-08-11 ENCOUNTER — Other Ambulatory Visit: Payer: Self-pay | Admitting: Cardiology

## 2024-08-13 ENCOUNTER — Telehealth: Payer: Self-pay | Admitting: Cardiology

## 2024-08-13 NOTE — Telephone Encounter (Signed)
 Patient left VM stating she has questions about her weight loss shots.

## 2024-08-14 NOTE — Telephone Encounter (Signed)
 Spoke with patient and wants to move to the 1.7mg  dose and then I will submit the PA for 90rx so patient can get her last fill before medicaid stops supplying it

## 2024-08-15 ENCOUNTER — Other Ambulatory Visit: Payer: Self-pay

## 2024-08-15 DIAGNOSIS — Z713 Dietary counseling and surveillance: Secondary | ICD-10-CM

## 2024-08-15 DIAGNOSIS — E66813 Obesity, class 3: Secondary | ICD-10-CM

## 2024-08-15 MED ORDER — WEGOVY 1.7 MG/0.75ML ~~LOC~~ SOAJ: 1.7000 mg | SUBCUTANEOUS | 0 refills | Status: AC

## 2024-08-15 NOTE — Telephone Encounter (Signed)
 Working on the PA for the patient

## 2024-10-09 ENCOUNTER — Ambulatory Visit

## 2024-10-09 VITALS — BP 119/70 | Ht 69.0 in | Wt 260.5 lb

## 2024-10-09 DIAGNOSIS — Z3009 Encounter for other general counseling and advice on contraception: Secondary | ICD-10-CM

## 2024-10-09 DIAGNOSIS — Z3042 Encounter for surveillance of injectable contraceptive: Secondary | ICD-10-CM

## 2024-10-09 NOTE — Progress Notes (Signed)
 11 Weeks   2 Days since last Depo Voices no concerns today.  Counseled to adhere to 11 to 13 week intervals between depo injections for optimal benefit.  Depo given today per order by Clarita Narrow, FNP  dated 02/04/2024.  Tolerated well Given right deltoid.  Next depo due 12/25/2024 has reminder card.

## 2024-10-31 ENCOUNTER — Ambulatory Visit: Admitting: Cardiology
